# Patient Record
Sex: Female | Born: 1960 | Race: White | Hispanic: No | Marital: Married | Smoking: Former smoker
Health system: Southern US, Community
[De-identification: ages and names within clinical notes are randomized; demographics above are authoritative.]

## PROBLEM LIST (undated history)

## (undated) HISTORY — PX: OTHER SURGICAL HISTORY: SHX169

## (undated) HISTORY — PX: WISDOM TOOTH EXTRACTION: SHX21

---

## 1997-12-10 ENCOUNTER — Other Ambulatory Visit: Admission: RE | Admit: 1997-12-10 | Discharge: 1997-12-10 | Payer: Self-pay | Admitting: Gynecology

## 2002-04-25 ENCOUNTER — Other Ambulatory Visit: Admission: RE | Admit: 2002-04-25 | Discharge: 2002-04-25 | Payer: Self-pay | Admitting: Gynecology

## 2004-05-16 ENCOUNTER — Encounter: Admission: RE | Admit: 2004-05-16 | Discharge: 2004-05-16 | Payer: Self-pay | Admitting: Gynecology

## 2004-05-22 ENCOUNTER — Encounter: Admission: RE | Admit: 2004-05-22 | Discharge: 2004-05-22 | Payer: Self-pay | Admitting: Gynecology

## 2004-12-08 ENCOUNTER — Other Ambulatory Visit: Admission: RE | Admit: 2004-12-08 | Discharge: 2004-12-08 | Payer: Self-pay | Admitting: Gynecology

## 2006-03-05 ENCOUNTER — Ambulatory Visit: Payer: Self-pay | Admitting: Family Medicine

## 2011-02-25 ENCOUNTER — Other Ambulatory Visit: Payer: Self-pay | Admitting: Family Medicine

## 2011-02-25 ENCOUNTER — Ambulatory Visit (HOSPITAL_COMMUNITY)
Admission: RE | Admit: 2011-02-25 | Discharge: 2011-02-25 | Disposition: A | Discharge status: Home / Self Care | Payer: BC Managed Care – PPO | Source: Ambulatory Visit | Attending: Family Medicine | Admitting: Family Medicine

## 2011-02-25 DIAGNOSIS — R52 Pain, unspecified: Secondary | ICD-10-CM | POA: Insufficient documentation

## 2011-02-25 DIAGNOSIS — R0789 Other chest pain: Secondary | ICD-10-CM | POA: Insufficient documentation

## 2011-02-26 ENCOUNTER — Other Ambulatory Visit: Payer: Self-pay | Admitting: Family Medicine

## 2011-02-26 DIAGNOSIS — R52 Pain, unspecified: Secondary | ICD-10-CM

## 2011-03-19 ENCOUNTER — Other Ambulatory Visit (HOSPITAL_COMMUNITY): Payer: Self-pay | Admitting: Family Medicine

## 2011-03-19 DIAGNOSIS — R1084 Generalized abdominal pain: Secondary | ICD-10-CM

## 2011-03-24 ENCOUNTER — Other Ambulatory Visit (HOSPITAL_COMMUNITY): Payer: BC Managed Care – PPO

## 2011-03-24 ENCOUNTER — Ambulatory Visit (HOSPITAL_COMMUNITY)
Admission: RE | Admit: 2011-03-24 | Discharge: 2011-03-24 | Disposition: A | Discharge status: Home / Self Care | Payer: BC Managed Care – PPO | Source: Ambulatory Visit | Attending: Family Medicine | Admitting: Family Medicine

## 2011-03-24 DIAGNOSIS — R1084 Generalized abdominal pain: Secondary | ICD-10-CM

## 2011-03-24 DIAGNOSIS — K7689 Other specified diseases of liver: Secondary | ICD-10-CM | POA: Insufficient documentation

## 2011-03-25 ENCOUNTER — Other Ambulatory Visit (HOSPITAL_COMMUNITY): Payer: Self-pay | Admitting: Family Medicine

## 2011-03-25 DIAGNOSIS — R109 Unspecified abdominal pain: Secondary | ICD-10-CM

## 2011-03-31 ENCOUNTER — Ambulatory Visit (HOSPITAL_COMMUNITY)
Admission: RE | Admit: 2011-03-31 | Discharge: 2011-03-31 | Disposition: A | Discharge status: Home / Self Care | Payer: BC Managed Care – PPO | Source: Ambulatory Visit | Attending: Family Medicine | Admitting: Family Medicine

## 2011-03-31 DIAGNOSIS — R109 Unspecified abdominal pain: Secondary | ICD-10-CM

## 2011-03-31 DIAGNOSIS — R935 Abnormal findings on diagnostic imaging of other abdominal regions, including retroperitoneum: Secondary | ICD-10-CM | POA: Insufficient documentation

## 2011-04-06 ENCOUNTER — Other Ambulatory Visit (HOSPITAL_COMMUNITY): Payer: Self-pay | Admitting: Family Medicine

## 2011-04-06 DIAGNOSIS — K7689 Other specified diseases of liver: Secondary | ICD-10-CM

## 2011-04-08 ENCOUNTER — Ambulatory Visit (HOSPITAL_COMMUNITY)
Admission: RE | Admit: 2011-04-08 | Discharge: 2011-04-08 | Disposition: A | Discharge status: Home / Self Care | Payer: BC Managed Care – PPO | Source: Ambulatory Visit | Attending: Family Medicine | Admitting: Family Medicine

## 2011-04-08 DIAGNOSIS — K7689 Other specified diseases of liver: Secondary | ICD-10-CM | POA: Insufficient documentation

## 2011-04-08 DIAGNOSIS — D1803 Hemangioma of intra-abdominal structures: Secondary | ICD-10-CM | POA: Insufficient documentation

## 2011-04-08 MED ORDER — GADOBENATE DIMEGLUMINE 529 MG/ML IV SOLN
20.0000 mL | Freq: Once | INTRAVENOUS | Status: AC | PRN
  Administered 2011-04-08: 17 mL via INTRAVENOUS

## 2015-03-13 ENCOUNTER — Other Ambulatory Visit: Payer: Self-pay | Admitting: Adult Health Nurse Practitioner

## 2015-03-13 ENCOUNTER — Ambulatory Visit
Admission: RE | Admit: 2015-03-13 | Discharge: 2015-03-13 | Disposition: A | Discharge status: Home / Self Care | Payer: BLUE CROSS/BLUE SHIELD | Source: Ambulatory Visit | Attending: Adult Health Nurse Practitioner | Admitting: Adult Health Nurse Practitioner

## 2015-03-13 DIAGNOSIS — F17200 Nicotine dependence, unspecified, uncomplicated: Secondary | ICD-10-CM

## 2016-04-28 DIAGNOSIS — Z808 Family history of malignant neoplasm of other organs or systems: Secondary | ICD-10-CM | POA: Insufficient documentation

## 2016-04-28 DIAGNOSIS — E559 Vitamin D deficiency, unspecified: Secondary | ICD-10-CM | POA: Insufficient documentation

## 2016-04-28 DIAGNOSIS — J339 Nasal polyp, unspecified: Secondary | ICD-10-CM | POA: Insufficient documentation

## 2016-04-29 ENCOUNTER — Other Ambulatory Visit (HOSPITAL_COMMUNITY): Payer: Self-pay | Admitting: Adult Health Nurse Practitioner

## 2016-04-29 DIAGNOSIS — R221 Localized swelling, mass and lump, neck: Secondary | ICD-10-CM

## 2016-04-29 DIAGNOSIS — L659 Nonscarring hair loss, unspecified: Secondary | ICD-10-CM

## 2016-04-29 DIAGNOSIS — L853 Xerosis cutis: Secondary | ICD-10-CM

## 2016-05-06 ENCOUNTER — Ambulatory Visit (HOSPITAL_COMMUNITY)
Admission: RE | Admit: 2016-05-06 | Discharge: 2016-05-06 | Disposition: A | Discharge status: Home / Self Care | Payer: BLUE CROSS/BLUE SHIELD | Source: Ambulatory Visit | Attending: Adult Health Nurse Practitioner | Admitting: Adult Health Nurse Practitioner

## 2016-05-06 DIAGNOSIS — L853 Xerosis cutis: Secondary | ICD-10-CM | POA: Diagnosis not present

## 2016-05-06 DIAGNOSIS — R221 Localized swelling, mass and lump, neck: Secondary | ICD-10-CM | POA: Insufficient documentation

## 2016-05-06 DIAGNOSIS — E042 Nontoxic multinodular goiter: Secondary | ICD-10-CM | POA: Insufficient documentation

## 2016-05-06 DIAGNOSIS — L659 Nonscarring hair loss, unspecified: Secondary | ICD-10-CM | POA: Diagnosis not present

## 2016-05-22 DIAGNOSIS — J324 Chronic pansinusitis: Secondary | ICD-10-CM | POA: Insufficient documentation

## 2016-06-03 ENCOUNTER — Other Ambulatory Visit: Payer: Self-pay | Admitting: Otolaryngology

## 2016-06-03 DIAGNOSIS — J329 Chronic sinusitis, unspecified: Secondary | ICD-10-CM

## 2016-06-03 DIAGNOSIS — H9190 Unspecified hearing loss, unspecified ear: Secondary | ICD-10-CM

## 2016-06-17 ENCOUNTER — Ambulatory Visit
Admission: RE | Admit: 2016-06-17 | Discharge: 2016-06-17 | Disposition: A | Discharge status: Home / Self Care | Payer: BLUE CROSS/BLUE SHIELD | Source: Ambulatory Visit | Attending: Otolaryngology | Admitting: Otolaryngology

## 2016-06-17 DIAGNOSIS — H9190 Unspecified hearing loss, unspecified ear: Secondary | ICD-10-CM

## 2016-06-17 DIAGNOSIS — J329 Chronic sinusitis, unspecified: Secondary | ICD-10-CM

## 2016-06-17 MED ORDER — GADOBENATE DIMEGLUMINE 529 MG/ML IV SOLN
20.0000 mL | Freq: Once | INTRAVENOUS | Status: AC | PRN
  Administered 2016-06-17: 16 mL via INTRAVENOUS

## 2017-08-10 DIAGNOSIS — K7689 Other specified diseases of liver: Secondary | ICD-10-CM | POA: Insufficient documentation

## 2017-08-11 ENCOUNTER — Other Ambulatory Visit (HOSPITAL_COMMUNITY): Payer: Self-pay | Admitting: Adult Health Nurse Practitioner

## 2017-08-11 DIAGNOSIS — K7689 Other specified diseases of liver: Secondary | ICD-10-CM

## 2017-08-18 ENCOUNTER — Ambulatory Visit (HOSPITAL_COMMUNITY): Payer: BLUE CROSS/BLUE SHIELD

## 2019-03-29 ENCOUNTER — Other Ambulatory Visit: Payer: Self-pay

## 2019-03-29 ENCOUNTER — Ambulatory Visit: Payer: 59 | Admitting: Family Medicine

## 2019-03-29 ENCOUNTER — Ambulatory Visit (INDEPENDENT_AMBULATORY_CARE_PROVIDER_SITE_OTHER): Payer: 59

## 2019-03-29 ENCOUNTER — Encounter: Payer: Self-pay | Admitting: Family Medicine

## 2019-03-29 VITALS — BP 117/77 | HR 68 | Temp 99.1°F | Ht 67.0 in | Wt 197.0 lb

## 2019-03-29 DIAGNOSIS — N951 Menopausal and female climacteric states: Secondary | ICD-10-CM

## 2019-03-29 DIAGNOSIS — R0600 Dyspnea, unspecified: Secondary | ICD-10-CM | POA: Diagnosis not present

## 2019-03-29 DIAGNOSIS — E669 Obesity, unspecified: Secondary | ICD-10-CM

## 2019-03-29 DIAGNOSIS — R042 Hemoptysis: Secondary | ICD-10-CM

## 2019-03-29 DIAGNOSIS — Z7689 Persons encountering health services in other specified circumstances: Secondary | ICD-10-CM | POA: Diagnosis not present

## 2019-03-29 DIAGNOSIS — R0609 Other forms of dyspnea: Secondary | ICD-10-CM

## 2019-03-29 DIAGNOSIS — F17200 Nicotine dependence, unspecified, uncomplicated: Secondary | ICD-10-CM | POA: Diagnosis not present

## 2019-03-29 DIAGNOSIS — Z1211 Encounter for screening for malignant neoplasm of colon: Secondary | ICD-10-CM

## 2019-03-29 DIAGNOSIS — E559 Vitamin D deficiency, unspecified: Secondary | ICD-10-CM

## 2019-03-29 DIAGNOSIS — D1803 Hemangioma of intra-abdominal structures: Secondary | ICD-10-CM

## 2019-03-29 MED ORDER — VARENICLINE TARTRATE 1 MG PO TABS
1.0000 mg | ORAL_TABLET | Freq: Two times a day (BID) | ORAL | 2 refills | Status: DC
Start: 2019-03-29 — End: 2021-03-18

## 2019-03-29 MED ORDER — CHANTIX STARTING MONTH PAK 0.5 MG X 11 & 1 MG X 42 PO TABS: ORAL_TABLET | ORAL | 0 refills | Status: DC

## 2019-03-29 NOTE — Patient Instructions (Signed)
Call me about mammogram location and I will order.  Referred to GI today in [place].  We will set up CT chest and possible MRI of liver.

## 2019-03-29 NOTE — Progress Notes (Signed)
Subjective: Jennifer Schwartz care, liver hemangioma, tobacco use disorder HPI: Jennifer Schwartz is a 59 y.o. female presenting to clinic today for:  1.  Liver hemangioma Patient reports that she was found to have a liver hemangioma on imaging.  She had a MRI abdomen in 2013 which characterize this is a 1.7 cm benign hemangioma in the posterior right hepatic lobe with a less than 1 cm cyst in the anterior liver at the junction of the right and left hepatic lobes.  There is no evidence of abdominal malignancy or other significant abnormalities.  She does not report any right upper quadrant pain, nausea, vomiting.  No jaundice.  2.  Tobacco use disorder Patient reports 1 pack/day smoking for 40 years.  She has had 1 episode of scant hemoptysis.  She does have shortness of breath on exertion.  She is wondering if she should get imaging of her chest to further delineate this.  3.  Family history of cardiovascular disease Patient reports several strokes and aneurysms in multiple family members.  She is a smoker as above.  No history of hyperlipidemia, hypertension or diabetes.  4.  Preventive care Patient is undecided as to where she would like to get her mammogram done.  She would like to be referred to [facility] for colonoscopy.  Her last Pap smear was in 1994.  She has had HIV negative testing with previous pregnancy.  She notes that she has only had 3 periods in the last year.  She wonders if she is nearing menopause.  Her mom went through menopause in her 77s and ultimately required uterine surgery because she had excessive bleeding.  History reviewed. No pertinent past medical history. Past Surgical History:  Procedure Laterality Date  . NASAL POLYPS  1988 AND 1998   Social History   Socioeconomic History  . Marital status: Married    Spouse name: Not on file  . Number of children: Not on file  . Years of education: Not on file  . Highest education level: Not on file  Occupational History   . Not on file  Tobacco Use  . Smoking status: Not on file  Substance and Sexual Activity  . Alcohol use: Not on file  . Drug use: Not on file  . Sexual activity: Not on file  Other Topics Concern  . Not on file  Social History Narrative  . Not on file   Social Determinants of Health   Financial Resource Strain:   . Difficulty of Paying Living Expenses:   Food Insecurity:   . Worried About Charity fundraiser in the Last Year:   . Arboriculturist in the Last Year:   Transportation Needs:   . Film/video editor (Medical):   Marland Kitchen Lack of Transportation (Non-Medical):   Physical Activity:   . Days of Exercise per Week:   . Minutes of Exercise per Session:   Stress:   . Feeling of Stress :   Social Connections:   . Frequency of Communication with Friends and Family:   . Frequency of Social Gatherings with Friends and Family:   . Attends Religious Services:   . Active Member of Clubs or Organizations:   . Attends Archivist Meetings:   Marland Kitchen Marital Status:   Intimate Partner Violence:   . Fear of Current or Ex-Partner:   . Emotionally Abused:   Marland Kitchen Physically Abused:   . Sexually Abused:    Current Meds  Medication Sig  . Cholecalciferol (  VITAMIN D3) 1.25 MG (50000 UT) CAPS Take 1 capsule by mouth daily.   Family History  Problem Relation Age of Onset  . Diabetes Mother   . Stroke Mother   . Heart disease Mother   . Heart disease Father   . Hypertension Father    No Known Allergies   Health Maintenance:mammogram, colon ROS: Per HPI  Objective: Office vital signs reviewed. BP 117/77   Pulse 68   Temp 99.1 F (37.3 C) (Temporal)   Ht 5' 7"  (1.702 m)   Wt 197 lb (89.4 kg)   SpO2 98%   BMI 30.85 kg/m   Physical Examination:  General: Awake, alert, well nourished, No acute distress HEENT: Normal, sclera white.  Moist mucous membranes.  No carotid bruits Cardio: regular rate and rhythm, S1S2 heard, no murmurs appreciated Pulm: clear to auscultation  bilaterally, no wheezes, rhonchi or rales; normal work of breathing on room air GI: soft, non-tender, non-distended, bowel sounds present x4, no hepatomegaly, no splenomegaly, no masses Extremities: warm, well perfused, No edema, cyanosis or clubbing; +2 pulses bilaterally MSK: normal gait and station Skin: dry; intact; no rashes or lesions Neuro: No focal neurologic deficits  No results found. Assessment/ Plan: 58 y.o. female   1. Dyspnea on exertion Chest x-ray unremarkable. - DG Chest 2 View; Future - CBC; Future  2. Hemoptysis CT chest ordered given negative x-ray. - CBC; Future  3. Tobacco use disorder Chantix Rx'd. - DG Chest 2 View; Future - varenicline (CHANTIX STARTING MONTH PAK) 0.5 MG X 11 & 1 MG X 42 tablet; Take 0.5 mg tablet by mouth once daily x3 days, then 0.5 mg tablet twice daily x4 days, then increase to one 1 mg tablet twice daily.  Dispense: 53 tablet; Refill: 0 - varenicline (CHANTIX CONTINUING MONTH PAK) 1 MG tablet; Take 1 tablet (1 mg total) by mouth 2 (two) times daily.  Dispense: 60 tablet; Refill: 2  4. Establishing care with new doctor, encounter for  5. Hemangioma of liver Found to be benign on MRI. <5cm (1.7cm on MRI), therefore no further evaluation needed.  6. Perimenopausal - FSH/LH; Future  7. Obesity (BMI 30-39.9) - Bayer DCA Hb A1c Waived; Future - Lipid panel; Future - CMP14+EGFR; Future  8. Vitamin D deficiency - VITAMIN D 25 Hydroxy (Vit-D Deficiency, Fractures); Future  9. Screen for colon cancer - Ambulatory referral to Gastroenterology   Janora Norlander, [place] [phone]

## 2019-03-31 DIAGNOSIS — N951 Menopausal and female climacteric states: Secondary | ICD-10-CM | POA: Insufficient documentation

## 2019-03-31 DIAGNOSIS — E669 Obesity, unspecified: Secondary | ICD-10-CM | POA: Insufficient documentation

## 2019-03-31 DIAGNOSIS — D1803 Hemangioma of intra-abdominal structures: Secondary | ICD-10-CM | POA: Insufficient documentation

## 2019-03-31 DIAGNOSIS — F17200 Nicotine dependence, unspecified, uncomplicated: Secondary | ICD-10-CM | POA: Insufficient documentation

## 2019-04-03 ENCOUNTER — Telehealth: Payer: Self-pay | Admitting: Family Medicine

## 2019-04-03 ENCOUNTER — Encounter: Payer: Self-pay | Admitting: Family Medicine

## 2019-04-03 NOTE — Telephone Encounter (Signed)
Pt aware of normal CXR results.

## 2019-04-10 ENCOUNTER — Ambulatory Visit (HOSPITAL_COMMUNITY): Payer: 59

## 2019-04-12 ENCOUNTER — Encounter: Payer: Self-pay | Admitting: *Deleted

## 2019-06-06 ENCOUNTER — Other Ambulatory Visit: Payer: Self-pay | Admitting: Family Medicine

## 2019-06-06 DIAGNOSIS — R042 Hemoptysis: Secondary | ICD-10-CM

## 2019-06-06 DIAGNOSIS — R0609 Other forms of dyspnea: Secondary | ICD-10-CM

## 2019-06-06 DIAGNOSIS — R0602 Shortness of breath: Secondary | ICD-10-CM

## 2019-06-13 ENCOUNTER — Ambulatory Visit (HOSPITAL_COMMUNITY)
Admission: RE | Admit: 2019-06-13 | Discharge: 2019-06-13 | Disposition: A | Discharge status: Home / Self Care | Payer: No Typology Code available for payment source | Source: Ambulatory Visit | Attending: Family Medicine | Admitting: Family Medicine

## 2019-06-13 ENCOUNTER — Other Ambulatory Visit: Payer: Self-pay

## 2019-06-13 DIAGNOSIS — R0602 Shortness of breath: Secondary | ICD-10-CM | POA: Diagnosis not present

## 2019-06-21 ENCOUNTER — Other Ambulatory Visit: Payer: Self-pay | Admitting: Family Medicine

## 2019-06-21 DIAGNOSIS — Z808 Family history of malignant neoplasm of other organs or systems: Secondary | ICD-10-CM

## 2019-06-21 DIAGNOSIS — E079 Disorder of thyroid, unspecified: Secondary | ICD-10-CM

## 2019-06-23 ENCOUNTER — Other Ambulatory Visit: Payer: Self-pay

## 2019-06-23 ENCOUNTER — Other Ambulatory Visit: Payer: No Typology Code available for payment source

## 2019-06-23 DIAGNOSIS — R042 Hemoptysis: Secondary | ICD-10-CM

## 2019-06-23 DIAGNOSIS — N951 Menopausal and female climacteric states: Secondary | ICD-10-CM

## 2019-06-23 DIAGNOSIS — R0609 Other forms of dyspnea: Secondary | ICD-10-CM

## 2019-06-23 DIAGNOSIS — E559 Vitamin D deficiency, unspecified: Secondary | ICD-10-CM

## 2019-06-23 DIAGNOSIS — E669 Obesity, unspecified: Secondary | ICD-10-CM

## 2019-06-23 LAB — BAYER DCA HB A1C WAIVED: HB A1C (BAYER DCA - WAIVED): 5.9 % (ref <7.0)

## 2019-06-24 LAB — CBC
Hematocrit: 39.4 % (ref 34.0–46.6)
Hemoglobin: 13.3 g/dL (ref 11.1–15.9)
MCH: 29.3 pg (ref 26.6–33.0)
MCHC: 33.8 g/dL (ref 31.5–35.7)
MCV: 87 fL (ref 79–97)
Platelets: 311 10*3/uL (ref 150–450)
RBC: 4.54 x10E6/uL (ref 3.77–5.28)
RDW: 13.8 % (ref 11.7–15.4)
WBC: 5.2 10*3/uL (ref 3.4–10.8)

## 2019-06-24 LAB — CMP14+EGFR
ALT: 12 IU/L (ref 0–32)
AST: 15 IU/L (ref 0–40)
Albumin/Globulin Ratio: 1.7 (ref 1.2–2.2)
Albumin: 4.3 g/dL (ref 3.8–4.9)
Alkaline Phosphatase: 145 IU/L — ABNORMAL HIGH (ref 48–121)
BUN/Creatinine Ratio: 12 (ref 9–23)
BUN: 10 mg/dL (ref 6–24)
Bilirubin Total: 0.7 mg/dL (ref 0.0–1.2)
CO2: 24 mmol/L (ref 20–29)
Calcium: 9.3 mg/dL (ref 8.7–10.2)
Chloride: 104 mmol/L (ref 96–106)
Creatinine, Ser: 0.83 mg/dL (ref 0.57–1.00)
GFR calc Af Amer: 91 mL/min/{1.73_m2} (ref >59)
GFR calc non Af Amer: 79 mL/min/{1.73_m2} (ref >59)
Globulin, Total: 2.5 g/dL (ref 1.5–4.5)
Glucose: 96 mg/dL (ref 65–99)
Potassium: 4.4 mmol/L (ref 3.5–5.2)
Sodium: 141 mmol/L (ref 134–144)
Total Protein: 6.8 g/dL (ref 6.0–8.5)

## 2019-06-24 LAB — LIPID PANEL
Chol/HDL Ratio: 3.2 ratio (ref 0.0–4.4)
Cholesterol, Total: 172 mg/dL (ref 100–199)
HDL: 54 mg/dL (ref >39)
LDL Chol Calc (NIH): 104 mg/dL — ABNORMAL HIGH (ref 0–99)
Triglycerides: 77 mg/dL (ref 0–149)
VLDL Cholesterol Cal: 14 mg/dL (ref 5–40)

## 2019-06-24 LAB — THYROID PANEL WITH TSH
Free Thyroxine Index: 1.5 (ref 1.2–4.9)
T3 Uptake Ratio: 25 % (ref 24–39)
T4, Total: 5.9 ug/dL (ref 4.5–12.0)
TSH: 1.2 u[IU]/mL (ref 0.450–4.500)

## 2019-06-24 LAB — VITAMIN D 25 HYDROXY (VIT D DEFICIENCY, FRACTURES): Vit D, 25-Hydroxy: 60.4 ng/mL (ref 30.0–100.0)

## 2019-06-24 LAB — FSH/LH
FSH: 32.1 m[IU]/mL
LH: 29.9 m[IU]/mL

## 2019-06-29 ENCOUNTER — Ambulatory Visit (HOSPITAL_COMMUNITY)
Admission: RE | Admit: 2019-06-29 | Discharge: 2019-06-29 | Disposition: A | Discharge status: Home / Self Care | Payer: No Typology Code available for payment source | Source: Ambulatory Visit | Attending: Family Medicine | Admitting: Family Medicine

## 2019-06-29 ENCOUNTER — Other Ambulatory Visit: Payer: Self-pay

## 2019-06-29 DIAGNOSIS — Z808 Family history of malignant neoplasm of other organs or systems: Secondary | ICD-10-CM | POA: Diagnosis present

## 2019-06-29 DIAGNOSIS — E0789 Other specified disorders of thyroid: Secondary | ICD-10-CM | POA: Insufficient documentation

## 2019-06-29 DIAGNOSIS — E079 Disorder of thyroid, unspecified: Secondary | ICD-10-CM

## 2019-06-30 ENCOUNTER — Telehealth: Payer: Self-pay | Admitting: Family Medicine

## 2019-06-30 NOTE — Telephone Encounter (Signed)
Reviewed Thyroid US report with pt and pt states she will discuss further at upcoming appt with Dr Darnell Level on Thursday.

## 2019-06-30 NOTE — Telephone Encounter (Signed)
Pt returning call to get x-ray results.

## 2019-07-03 ENCOUNTER — Encounter: Payer: 59 | Admitting: Family Medicine

## 2019-07-04 ENCOUNTER — Other Ambulatory Visit: Payer: Self-pay

## 2019-07-04 ENCOUNTER — Other Ambulatory Visit (HOSPITAL_COMMUNITY)
Admission: RE | Admit: 2019-07-04 | Discharge: 2019-07-04 | Disposition: A | Discharge status: Home / Self Care | Payer: No Typology Code available for payment source | Source: Ambulatory Visit | Attending: Family Medicine | Admitting: Family Medicine

## 2019-07-04 ENCOUNTER — Encounter: Payer: Self-pay | Admitting: Family Medicine

## 2019-07-04 ENCOUNTER — Ambulatory Visit (INDEPENDENT_AMBULATORY_CARE_PROVIDER_SITE_OTHER): Payer: No Typology Code available for payment source | Admitting: Family Medicine

## 2019-07-04 VITALS — BP 106/75 | HR 65 | Temp 97.7°F | Ht 67.0 in | Wt 195.8 lb

## 2019-07-04 DIAGNOSIS — F17200 Nicotine dependence, unspecified, uncomplicated: Secondary | ICD-10-CM | POA: Diagnosis not present

## 2019-07-04 DIAGNOSIS — Z01411 Encounter for gynecological examination (general) (routine) with abnormal findings: Secondary | ICD-10-CM

## 2019-07-04 DIAGNOSIS — Z124 Encounter for screening for malignant neoplasm of cervix: Secondary | ICD-10-CM | POA: Insufficient documentation

## 2019-07-04 DIAGNOSIS — Z01419 Encounter for gynecological examination (general) (routine) without abnormal findings: Secondary | ICD-10-CM | POA: Diagnosis present

## 2019-07-04 NOTE — Progress Notes (Signed)
Jennifer Schwartz is a 59 y.o. female presents to office today for annual physical exam examination.    Concerns today include:   Substance: Reports that she is doing really well with smoking cessation.  She is down from over a pack per day to 6 cigarettes/day.  She is currently taking the Chantix twice daily and not having any side effects from the medication.  Denies any hemoptysis, sore throat, oropharyngeal masses  Last colonoscopy: Has not yet scheduled but it is on her to do list Last mammogram: Has not yet scheduled.  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 No pertinent past medical history. Social History   Socioeconomic History   Marital status: Married    Spouse name: Not on file   Number of children: Not on file   Years of education: Not on file   Highest education level: Not on file  Occupational History   Not on file  Tobacco Use   Smoking status: Current Every Day Smoker    Packs/day: 0.25   Smokeless tobacco: Never Used  Substance and Sexual Activity   Alcohol use: Not on file   Drug use: Never   Sexual activity: Not on file  Other Topics Concern   Not on file  Social History Narrative   Not on file   Social Determinants of Health   Financial Resource Strain:    Difficulty of Paying Living Expenses:   Food Insecurity:    Worried About Charity fundraiser in the Last Year:    Arboriculturist in the Last Year:   Transportation Needs:    Film/video editor (Medical):    Lack of Transportation (Non-Medical):   Physical Activity:    Days of Exercise per Week:    Minutes of Exercise per Session:   Stress:    Feeling of Stress :   Social Connections:    Frequency of Communication with Friends and Family:    Frequency of Social Gatherings with Friends and Family:    Attends Religious Services:    Active Member of Clubs  or Organizations:    Attends Music therapist:    Marital Status:   Intimate Partner Violence:    Fear of Current or Ex-Partner:    Emotionally Abused:    Physically Abused:    Sexually Abused:    Past Surgical History:  Procedure Laterality Date   NASAL POLYPS  1988 AND 1998   Family History  Problem Relation Age of Onset   Diabetes Mother    Stroke Mother    Heart disease Mother    Heart disease Father    Hypertension Father     Current Outpatient Medications:    varenicline (CHANTIX CONTINUING MONTH PAK) 1 MG tablet, Take 1 tablet (1 mg total) by mouth 2 (two) times daily., Disp: 60 tablet, Rfl: 2  No Known Allergies   ROS: Review of Systems Pertinent items noted in HPI and remainder of comprehensive ROS otherwise negative.    Physical exam BP 106/75    Pulse 65    Temp 97.7 F (36.5 C)    Ht 5\' 7"  (1.702 m)    Wt 195 lb 12.8 oz (88.8 kg)    SpO2 99%    BMI 30.67 kg/m  General appearance: alert, awake, well-appearing, no acute distress Head: Normocephalic, without obvious abnormality, atraumatic Eyes: negative findings: lids and lashes normal,  conjunctivae and sclerae normal, corneas clear and pupils equal, round, reactive to light and accomodation Ears: normal TM's and external ear canals both ears Nose: Nares normal. Septum midline. Mucosa normal. No drainage or sinus tenderness. Throat: lips, mucosa, and tongue normal; teeth and gums normal and No oropharyngeal masses Neck: no adenopathy, no carotid bruit, supple, symmetrical, trachea midline and thyroid not enlarged, symmetric, no tenderness/mass/nodules Back: symmetric, no curvature. ROM normal. No CVA tenderness. Lungs: clear to auscultation bilaterally Breasts: normal appearance, no masses or tenderness, No nipple retraction or dimpling, No nipple discharge or bleeding, No axillary or supraclavicular adenopathy Heart: regular rate and rhythm, S1, S2 normal, no murmur, click, rub or  gallop Abdomen: soft, non-tender; bowel sounds normal; no masses,  no organomegaly Pelvic: cervix normal in appearance, external genitalia normal, no adnexal masses or tenderness, no cervical motion tenderness, rectovaginal septum normal, uterus normal size, shape, and consistency and vagina normal without discharge Extremities: extremities normal, atraumatic, no cyanosis or edema Pulses: 2+ and symmetric Skin: She has a seborrheic keratosis noted along the left hip.  Skin otherwise unremarkable Lymph nodes: Cervical, supraclavicular, and axillary nodes normal. Neurologic: Alert and oriented X 3, normal strength and tone. Normal symmetric reflexes. Normal coordination and gait Psych: Mood stable, speech normal, affect appropriate, pleasant and interactive Depression screen [facility] 2/9 07/04/2019 03/29/2019  Decreased Interest 0 0  Down, Depressed, Hopeless 0 0  PHQ - 2 Score 0 0  Altered sleeping - 0  Tired, decreased energy - 0  Change in appetite - 0  Feeling bad or failure about yourself  - 0  Trouble concentrating - 0  Moving slowly or fidgety/restless - 0  PHQ-9 Score - 0      Assessment/ Plan: Jennifer Schwartz here for annual physical exam.   1. Well woman exam with routine gynecological exam Pap smear performed today.  Will contact patient with results once available.  She will schedule mammogram and colonoscopy - Cytology - PAP  2. Screening for malignant neoplasm of cervix - Cytology - PAP  3. Tobacco use disorder She is doing an excellent job with smoking cessation is down to 6 cigarettes.  We discussed okay to continue Chantix for 1 month beyond actual cessation of smoking to maintain remission and prevent relapse.  She will contact me if needed for refills  Counseled on healthy lifestyle choices, including diet (rich in fruits, vegetables and lean meats and low in salt and simple carbohydrates) and exercise (at least 30 minutes of moderate physical activity daily).  Patient  to follow up in 1 year for annual exam or sooner if needed.  Giuseppina Quinones M. Lajuana Ripple, DO

## 2019-07-04 NOTE — Patient Instructions (Signed)
Health Maintenance, Female Adopting a healthy lifestyle and getting preventive care are important in promoting health and wellness. Ask your health care provider about:  The right schedule for you to have regular tests and exams.  Things you can do on your own to prevent diseases and keep yourself healthy. What should I know about diet, weight, and exercise? Eat a healthy diet   Eat a diet that includes plenty of vegetables, fruits, low-fat dairy products, and lean protein.  Do not eat a lot of foods that are high in solid fats, added sugars, or sodium. Maintain a healthy weight Body mass index (BMI) is used to identify weight problems. It estimates body fat based on height and weight. Your health care provider can help determine your BMI and help you achieve or maintain a healthy weight. Get regular exercise Get regular exercise. This is one of the most important things you can do for your health. Most adults should:  Exercise for at least 150 minutes each week. The exercise should increase your heart rate and make you sweat (moderate-intensity exercise).  Do strengthening exercises at least twice a week. This is in addition to the moderate-intensity exercise.  Spend less time sitting. Even light physical activity can be beneficial. Watch cholesterol and blood lipids Have your blood tested for lipids and cholesterol at 58 years of age, then have this test every 5 years. Have your cholesterol levels checked more often if:  Your lipid or cholesterol levels are high.  You are older than 58 years of age.  You are at high risk for heart disease. What should I know about cancer screening? Depending on your health history and family history, you may need to have cancer screening at various ages. This may include screening for:  Breast cancer.  Cervical cancer.  Colorectal cancer.  Skin cancer.  Lung cancer. What should I know about heart disease, diabetes, and high blood  pressure? Blood pressure and heart disease  High blood pressure causes heart disease and increases the risk of stroke. This is more likely to develop in people who have high blood pressure readings, are of African descent, or are overweight.  Have your blood pressure checked: ? Every 3-5 years if you are 18-39 years of age. ? Every year if you are 40 years old or older. Diabetes Have regular diabetes screenings. This checks your fasting blood sugar level. Have the screening done:  Once every three years after age 40 if you are at a normal weight and have a low risk for diabetes.  More often and at a younger age if you are overweight or have a high risk for diabetes. What should I know about preventing infection? Hepatitis B If you have a higher risk for hepatitis B, you should be screened for this virus. Talk with your health care provider to find out if you are at risk for hepatitis B infection. Hepatitis C Testing is recommended for:  Everyone born from 1945 through 1965.  Anyone with known risk factors for hepatitis C. Sexually transmitted infections (STIs)  Get screened for STIs, including gonorrhea and chlamydia, if: ? You are sexually active and are younger than 58 years of age. ? You are older than 58 years of age and your health care provider tells you that you are at risk for this type of infection. ? Your sexual activity has changed since you were last screened, and you are at increased risk for chlamydia or gonorrhea. Ask your health care provider if   you are at risk.  Ask your health care provider about whether you are at high risk for HIV. Your health care provider may recommend a prescription medicine to help prevent HIV infection. If you choose to take medicine to prevent HIV, you should first get tested for HIV. You should then be tested every 3 months for as long as you are taking the medicine. Pregnancy  If you are about to stop having your period (premenopausal) and  you may become pregnant, seek counseling before you get pregnant.  Take 400 to 800 micrograms (mcg) of folic acid every day if you become pregnant.  Ask for birth control (contraception) if you want to prevent pregnancy. Osteoporosis and menopause Osteoporosis is a disease in which the bones lose minerals and strength with aging. This can result in bone fractures. If you are 65 years old or older, or if you are at risk for osteoporosis and fractures, ask your health care provider if you should:  Be screened for bone loss.  Take a calcium or vitamin D supplement to lower your risk of fractures.  Be given hormone replacement therapy (HRT) to treat symptoms of menopause. Follow these instructions at home: Lifestyle  Do not use any products that contain nicotine or tobacco, such as cigarettes, e-cigarettes, and chewing tobacco. If you need help quitting, ask your health care provider.  Do not use street drugs.  Do not share needles.  Ask your health care provider for help if you need support or information about quitting drugs. Alcohol use  Do not drink alcohol if: ? Your health care provider tells you not to drink. ? You are pregnant, may be pregnant, or are planning to become pregnant.  If you drink alcohol: ? Limit how much you use to 0-1 drink a day. ? Limit intake if you are breastfeeding.  Be aware of how much alcohol is in your drink. In the [place] one drink equals one 12 oz bottle of beer (355 mL), one 5 oz glass of wine (148 mL), or one 1 oz glass of hard liquor (44 mL). General instructions  Schedule regular health, dental, and eye exams.  Stay current with your vaccines.  Tell your health care provider if: ? You often feel depressed. ? You have ever been abused or do not feel safe at home. Summary  Adopting a healthy lifestyle and getting preventive care are important in promoting health and wellness.  Follow your health care provider's instructions about healthy  diet, exercising, and getting tested or screened for diseases.  Follow your health care provider's instructions on monitoring your cholesterol and blood pressure. This information is not intended to replace advice given to you by your health care provider. Make sure you discuss any questions you have with your health care provider. Document Revised: 12/22/2017 Document Reviewed: 12/22/2017 Elsevier Patient Education  2020 [employer] Inc[employer]  

## 2019-07-06 LAB — CYTOLOGY - PAP
Comment: NEGATIVE
Diagnosis: NEGATIVE
High risk HPV: POSITIVE — AB

## 2021-03-18 ENCOUNTER — Ambulatory Visit: Payer: No Typology Code available for payment source | Admitting: Nurse Practitioner

## 2021-03-18 ENCOUNTER — Encounter: Payer: Self-pay | Admitting: Nurse Practitioner

## 2021-03-18 VITALS — BP 117/76 | HR 91 | Temp 98.1°F | Resp 20 | Ht 67.0 in | Wt 197.0 lb

## 2021-03-18 DIAGNOSIS — R002 Palpitations: Secondary | ICD-10-CM

## 2021-03-18 DIAGNOSIS — E042 Nontoxic multinodular goiter: Secondary | ICD-10-CM | POA: Diagnosis not present

## 2021-03-18 NOTE — Patient Instructions (Signed)
Thyroid Nodule ?A thyroid nodule is an isolated growth of thyroid cells that forms a lump in your thyroid gland. The thyroid gland is a butterfly-shaped gland. It is found in the lower front of your neck. This gland sends chemical messengers (hormones) through your blood to all parts of your body. These hormones are important in regulating your body temperature and helping your body to use energy. ?Thyroid nodules are common. Most are not cancerous (benign). You may have one nodule or several nodules. ?Different types of thyroid nodules include nodules that: ?Grow and fill with fluid (thyroid cysts). ?Produce too much thyroid hormone (hot nodules or hyperthyroid). ?Produce no thyroid hormone (cold nodules or hypothyroid). ?Form from cancer cells (thyroid cancers). ?What are the causes? ?In most cases, the cause of this condition is not known. ?What increases the risk? ?The following factors may make you more likely to develop this condition. ?Age. Thyroid nodules become more common in people who are older than 60 years of age. ?Gender. ?Benign thyroid nodules are more common in women. ?Cancerous (malignant) thyroid nodules are more common in men. ?A family history that includes: ?Thyroid nodules. ?Pheochromocytoma. ?Thyroid carcinoma. ?Hyperparathyroidism. ?Certain kinds of thyroid diseases, such as Hashimoto's thyroiditis. ?Lack of iodine in your diet. ?A history of head and neck radiation, such as from previous cancer treatment. ?What are the signs or symptoms? ?In many cases, there are no symptoms. If you have symptoms, they may include: ?A lump in your lower neck. ?Feeling a lump or tickle in your throat. ?Pain in your neck, jaw, or ear. ?Having trouble swallowing. ?Hot nodules may cause symptoms that include: ?Weight loss. ?Warm, flushed skin. ?Feeling hot. ?Feeling nervous. ?A racing heartbeat. ?Cold nodules may cause symptoms that include: ?Weight gain. ?Dry skin. ?Brittle hair. This may also occur with hair  loss. ?Feeling cold. ?Fatigue. ?Thyroid cancer nodules may cause symptoms that include: ?Hard nodules that feel stuck to the thyroid gland. ?Hoarseness. ?Lumps in the glands near your thyroid (lymph nodes). ?How is this diagnosed? ?A thyroid nodule may be felt by your health care provider during a physical exam. This condition may also be diagnosed based on your symptoms. You may also have tests, including: ?An ultrasound. This may be done to confirm the diagnosis. ?A biopsy. This involves taking a sample from the nodule and looking at it under a microscope. ?Blood tests to make sure that your thyroid is working properly. ?A thyroid scan. This test uses a radioactive tracer injected into a vein to create an image of the thyroid gland on a computer screen. ?Imaging tests such as MRI or CT scan. These may be done if: ?Your nodule is large. ?Your nodule is blocking your airway. ?Cancer is suspected. ?How is this treated? ?Treatment depends on the cause and size of your nodule or nodules. If the nodule is benign, treatment may not be necessary. Your health care provider may monitor the nodule to see if it goes away without treatment. If the nodule continues to grow, is cancerous, or does not go away, treatment may be needed. Treatment may include: ?Having a cystic nodule drained with a needle. ?Ablation therapy. In this treatment, alcohol is injected into the area of the nodule to destroy the cells. Ablation with heat (thermal ablation) may also be used. ?Radioactive iodine. In this treatment, radioactive iodine is given as a pill or liquid that you drink. This substance causes the thyroid nodule to shrink. ?Surgery to remove the nodule. Part or all of your thyroid gland may  need to be removed as well. ?Medicines. ?Follow these instructions at home: ?Pay attention to any changes in your nodule. ?Take over-the-counter and prescription medicines only as told by your health care provider. ?Keep all follow-up visits as told  by your health care provider. This is important. ?Contact a health care provider if: ?Your voice changes. ?You have trouble swallowing. ?You have pain in your neck, ear, or jaw that is getting worse. ?Your nodule gets bigger. ?Your nodule starts to make it harder for you to breathe. ?Your muscles look like they are shrinking (muscle wasting). ?Get help right away if: ?You have chest pain. ?There is a loss of consciousness. ?You have a sudden fever. ?You feel confused. ?You are seeing or hearing things that other people do not see or hear (having hallucinations). ?You feel very weak. ?You have mood swings. ?You feel very restless. ?You feel suddenly nauseous or throw up. ?You suddenly have diarrhea. ?Summary ?A thyroid nodule is an isolated growth of thyroid cells that forms a lump in your thyroid gland. ?Thyroid nodules are common. Most are not cancerous (benign). You may have one nodule or several nodules. ?Treatment depends on the cause and size of your nodule or nodules. If the nodule is benign, treatment may not be necessary. ?Your health care provider may monitor the nodule to see if it goes away without treatment. If the nodule continues to grow, is cancerous, or does not go away, treatment may be needed. ?This information is not intended to replace advice given to you by your health care provider. Make sure you discuss any questions you have with your health care provider. ?Document Revised: 08/13/2017 Document Reviewed: 08/16/2017 ?Elsevier Patient Education ? [place]. ? ?

## 2021-03-18 NOTE — Progress Notes (Signed)
? ?  Subjective:  ? ? Patient ID: Jennifer Schwartz, female    DOB: 22-Sep-1960, 61 y.o.   MRN: [id number] ? ? ?Chief Complaint: Lumps on neck (SOB and feels like someone squeezing her wind pipe/Already diagnosed with nodules on thyroid) ? ? ?HPI ?Patient comes in today feeling a lump in her heck. She sometimes feels like she cannot swallow, but denies choking sensation. She has a history of thyroid nodules. Says she has felt very fatigued lately. She has not been seen and had blood work since 2021. ? ? ? ?Review of Systems  ?Constitutional: Negative.   ?HENT: Negative.  Negative for sore throat (very dry).   ?Respiratory:  Positive for shortness of breath.   ?Cardiovascular:  Positive for palpitations (almost daily). Negative for chest pain and leg swelling.  ?Neurological:  Positive for tremors (feels like sheis quivering.).  ?All other systems reviewed and are negative. ? ?   ?Objective:  ? Physical Exam ?Constitutional:   ?   Appearance: Normal appearance. She is obese.  ?Neck:  ?   Thyroid: Thyroid mass (bilaterally) present.  ?Cardiovascular:  ?   Rate and Rhythm: Normal rate and regular rhythm.  ?   Heart sounds: Normal heart sounds.  ?Pulmonary:  ?   Effort: Pulmonary effort is normal.  ?   Breath sounds: Normal breath sounds.  ?Skin: ?   General: Skin is warm.  ?Neurological:  ?   General: No focal deficit present.  ?   Mental Status: She is alert and oriented to person, place, and time.  ?   Cranial Nerves: No cranial nerve deficit.  ?   Sensory: No sensory deficit.  ? ?BP 117/76   Pulse 91   Temp 98.1 ?F (36.7 ?C) (Temporal)   Resp 20   Ht 5' 7" (1.702 m)   Wt 197 lb (89.4 kg)   SpO2 94%   BMI 30.85 kg/m?  ? ?Jennifer Nissen, FNP ? ? ? ?   ?Assessment & Plan:  ?Jennifer Schwartz in today with chief complaint of Lumps on neck (SOB and feels like someone squeezing her wind pipe/Already diagnosed with nodules on thyroid) ? ? ?1. Palpitations ?Avoid caffeine ?Keep diary of episodes ?- EKG  12-Lead ? ?2. Multiple thyroid nodules ?Labs pending ?Will call with lab results and U/S results ?- US THYROID; Future ?- CBC with Differential/Platelet ?- CMP14+EGFR ?- Thyroid Panel With TSH ? ? ? ?The above assessment and management plan was discussed with the patient. The patient verbalized understanding of and has agreed to the management plan. Patient is aware to call the clinic if symptoms persist or worsen. Patient is aware when to return to the clinic for a follow-up visit. Patient educated on when it is appropriate to go to the emergency department.  ? ?Jennifer Hassell Done, FNP ? ? ? ?

## 2021-03-19 LAB — CBC WITH DIFFERENTIAL/PLATELET
Basophils Absolute: 0 10*3/uL (ref 0.0–0.2)
Basos: 1 %
EOS (ABSOLUTE): 0.2 10*3/uL (ref 0.0–0.4)
Eos: 3 %
Hematocrit: 39.2 % (ref 34.0–46.6)
Hemoglobin: 13.4 g/dL (ref 11.1–15.9)
Immature Grans (Abs): 0 10*3/uL (ref 0.0–0.1)
Immature Granulocytes: 0 %
Lymphocytes Absolute: 1.8 10*3/uL (ref 0.7–3.1)
Lymphs: 32 %
MCH: 29.2 pg (ref 26.6–33.0)
MCHC: 34.2 g/dL (ref 31.5–35.7)
MCV: 85 fL (ref 79–97)
Monocytes Absolute: 0.4 10*3/uL (ref 0.1–0.9)
Monocytes: 7 %
Neutrophils Absolute: 3.2 10*3/uL (ref 1.4–7.0)
Neutrophils: 57 %
Platelets: 349 10*3/uL (ref 150–450)
RBC: 4.59 x10E6/uL (ref 3.77–5.28)
RDW: 14.1 % (ref 11.7–15.4)
WBC: 5.6 10*3/uL (ref 3.4–10.8)

## 2021-03-19 LAB — CMP14+EGFR
ALT: 15 IU/L (ref 0–32)
AST: 17 IU/L (ref 0–40)
Albumin/Globulin Ratio: 1.8 (ref 1.2–2.2)
Albumin: 4.2 g/dL (ref 3.8–4.9)
Alkaline Phosphatase: 115 IU/L (ref 44–121)
BUN/Creatinine Ratio: 15 (ref 9–23)
BUN: 11 mg/dL (ref 6–24)
Bilirubin Total: 0.7 mg/dL (ref 0.0–1.2)
CO2: 22 mmol/L (ref 20–29)
Calcium: 9.2 mg/dL (ref 8.7–10.2)
Chloride: 106 mmol/L (ref 96–106)
Creatinine, Ser: 0.74 mg/dL (ref 0.57–1.00)
Globulin, Total: 2.3 g/dL (ref 1.5–4.5)
Glucose: 101 mg/dL — ABNORMAL HIGH (ref 70–99)
Potassium: 4.3 mmol/L (ref 3.5–5.2)
Sodium: 142 mmol/L (ref 134–144)
Total Protein: 6.5 g/dL (ref 6.0–8.5)
eGFR: 93 mL/min/{1.73_m2} (ref >59)

## 2021-03-19 LAB — THYROID PANEL WITH TSH
Free Thyroxine Index: 1.7 (ref 1.2–4.9)
T3 Uptake Ratio: 25 % (ref 24–39)
T4, Total: 6.8 ug/dL (ref 4.5–12.0)
TSH: 1.18 u[IU]/mL (ref 0.450–4.500)

## 2021-03-25 ENCOUNTER — Other Ambulatory Visit: Payer: Self-pay

## 2021-03-25 ENCOUNTER — Ambulatory Visit (HOSPITAL_COMMUNITY)
Admission: RE | Admit: 2021-03-25 | Discharge: 2021-03-25 | Disposition: A | Discharge status: Home / Self Care | Payer: No Typology Code available for payment source | Source: Ambulatory Visit | Attending: Nurse Practitioner | Admitting: Nurse Practitioner

## 2021-03-25 DIAGNOSIS — E042 Nontoxic multinodular goiter: Secondary | ICD-10-CM | POA: Diagnosis present

## 2021-03-25 NOTE — Addendum Note (Signed)
Addended by: Chevis Pretty on: 03/25/2021 05:03 PM ? ? Modules accepted: Orders ? ?

## 2021-04-09 ENCOUNTER — Other Ambulatory Visit: Payer: Self-pay

## 2021-04-09 ENCOUNTER — Ambulatory Visit (HOSPITAL_COMMUNITY)
Admission: RE | Admit: 2021-04-09 | Discharge: 2021-04-09 | Disposition: A | Discharge status: Home / Self Care | Payer: No Typology Code available for payment source | Source: Ambulatory Visit | Attending: Nurse Practitioner | Admitting: Nurse Practitioner

## 2021-04-09 ENCOUNTER — Encounter (HOSPITAL_COMMUNITY): Payer: Self-pay

## 2021-04-09 DIAGNOSIS — E042 Nontoxic multinodular goiter: Secondary | ICD-10-CM | POA: Diagnosis present

## 2021-04-09 MED ORDER — LIDOCAINE HCL (PF) 2 % IJ SOLN
10.0000 mL | Freq: Once | INTRAMUSCULAR | Status: AC
Start: 2021-04-09 — End: 2021-04-09
  Administered 2021-04-09: 10 mL

## 2021-04-09 NOTE — Progress Notes (Signed)
PT tolerated thyroid biopsy procedure well today. Labs and afirma obtained and sent for pathology. PT ambulatory at discharge with no acute distress noted and verbalized understanding of discharge instructions. 

## 2021-04-11 LAB — CYTOLOGY - NON PAP

## 2021-05-14 ENCOUNTER — Other Ambulatory Visit (HOSPITAL_COMMUNITY)
Admission: RE | Admit: 2021-05-14 | Discharge: 2021-05-14 | Disposition: A | Discharge status: Home / Self Care | Payer: No Typology Code available for payment source | Source: Ambulatory Visit | Attending: Family Medicine | Admitting: Family Medicine

## 2021-05-14 ENCOUNTER — Ambulatory Visit (INDEPENDENT_AMBULATORY_CARE_PROVIDER_SITE_OTHER): Payer: No Typology Code available for payment source | Admitting: Family Medicine

## 2021-05-14 ENCOUNTER — Encounter: Payer: Self-pay | Admitting: Family Medicine

## 2021-05-14 VITALS — BP 121/79 | HR 66 | Temp 98.1°F | Ht 67.0 in | Wt 199.0 lb

## 2021-05-14 DIAGNOSIS — Z1231 Encounter for screening mammogram for malignant neoplasm of breast: Secondary | ICD-10-CM

## 2021-05-14 DIAGNOSIS — E042 Nontoxic multinodular goiter: Secondary | ICD-10-CM

## 2021-05-14 DIAGNOSIS — R87618 Other abnormal cytological findings on specimens from cervix uteri: Secondary | ICD-10-CM

## 2021-05-14 DIAGNOSIS — R499 Unspecified voice and resonance disorder: Secondary | ICD-10-CM

## 2021-05-14 DIAGNOSIS — F17200 Nicotine dependence, unspecified, uncomplicated: Secondary | ICD-10-CM

## 2021-05-14 DIAGNOSIS — Z1211 Encounter for screening for malignant neoplasm of colon: Secondary | ICD-10-CM

## 2021-05-14 DIAGNOSIS — R0989 Other specified symptoms and signs involving the circulatory and respiratory systems: Secondary | ICD-10-CM

## 2021-05-14 DIAGNOSIS — Z0001 Encounter for general adult medical examination with abnormal findings: Secondary | ICD-10-CM

## 2021-05-14 DIAGNOSIS — Z124 Encounter for screening for malignant neoplasm of cervix: Secondary | ICD-10-CM

## 2021-05-14 DIAGNOSIS — R09A2 Foreign body sensation, throat: Secondary | ICD-10-CM

## 2021-05-14 DIAGNOSIS — Z0000 Encounter for general adult medical examination without abnormal findings: Secondary | ICD-10-CM

## 2021-05-14 NOTE — Patient Instructions (Signed)
Get mammo scheduled, [facility] for [facility] ?Get colon cancer screen done. Referral to [facility] placed ?Get eval with ENT, referral placed to [facility]  ? ?Preventive Care 6-60 Years Old, Female ?Preventive care refers to lifestyle choices and visits with your health care provider that can promote health and wellness. Preventive care visits are also called wellness exams. ?What can I expect for my preventive care visit? ?Counseling ?Your health care provider may ask you questions about your: ?Medical history, including: ?Past medical problems. ?Family medical history. ?Pregnancy history. ?Current health, including: ?Menstrual cycle. ?Method of birth control. ?Emotional well-being. ?Home life and relationship well-being. ?Sexual activity and sexual health. ?Lifestyle, including: ?Alcohol, nicotine or tobacco, and drug use. ?Access to firearms. ?Diet, exercise, and sleep habits. ?Work and work Statistician. ?Sunscreen use. ?Safety issues such as seatbelt and bike helmet use. ?Physical exam ?Your health care provider will check your: ?Height and weight. These may be used to calculate your BMI (body mass index). BMI is a measurement that tells if you are at a healthy weight. ?Waist circumference. This measures the distance around your waistline. This measurement also tells if you are at a healthy weight and may help predict your risk of certain diseases, such as type 2 diabetes and high blood pressure. ?Heart rate and blood pressure. ?Body temperature. ?Skin for abnormal spots. ?What immunizations do I need? ? ?Vaccines are usually given at various ages, according to a schedule. Your health care provider will recommend vaccines for you based on your age, medical history, and lifestyle or other factors, such as travel or where you work. ?What tests do I need? ?Screening ?Your health care provider may recommend screening tests for certain conditions. This may include: ?Lipid and cholesterol levels. ?Diabetes screening.  This is done by checking your blood sugar (glucose) after you have not eaten for a while (fasting). ?Pelvic exam and Pap test. ?Hepatitis B test. ?Hepatitis C test. ?HIV (human immunodeficiency virus) test. ?STI (sexually transmitted infection) testing, if you are at risk. ?Lung cancer screening. ?Colorectal cancer screening. ?Mammogram. Talk with your health care provider about when you should start having regular mammograms. This may depend on whether you have a family history of breast cancer. ?BRCA-related cancer screening. This may be done if you have a family history of breast, ovarian, tubal, or peritoneal cancers. ?Bone density scan. This is done to screen for osteoporosis. ?Talk with your health care provider about your test results, treatment options, and if necessary, the need for more tests. ?Follow these instructions at home: ?Eating and drinking ? ?Eat a diet that includes fresh fruits and vegetables, whole grains, lean protein, and low-fat dairy products. ?Take vitamin and mineral supplements as recommended by your health care provider. ?Do not drink alcohol if: ?Your health care provider tells you not to drink. ?You are pregnant, may be pregnant, or are planning to become pregnant. ?If you drink alcohol: ?Limit how much you have to 0-1 drink a day. ?Know how much alcohol is in your drink. In the [place] one drink equals one 12 oz bottle of beer (355 mL), one 5 oz glass of wine (148 mL), or one 1? oz glass of hard liquor (44 mL). ?Lifestyle ?Brush your teeth every morning and night with fluoride toothpaste. Floss one time each day. ?Exercise for at least 30 minutes 5 or more days each week. ?Do not use any products that contain nicotine or tobacco. These products include cigarettes, chewing tobacco, and vaping devices, such as e-cigarettes. If you  need help quitting, ask your health care provider. ?Do not use drugs. ?If you are sexually active, practice safe sex. Use a condom or other form of protection  to prevent STIs. ?If you do not wish to become pregnant, use a form of birth control. If you plan to become pregnant, see your health care provider for a prepregnancy visit. ?Take aspirin only as told by your health care provider. Make sure that you understand how much to take and what form to take. Work with your health care provider to find out whether it is safe and beneficial for you to take aspirin daily. ?Find healthy ways to manage stress, such as: ?Meditation, yoga, or listening to music. ?Journaling. ?Talking to a trusted person. ?Spending time with friends and family. ?Minimize exposure to UV radiation to reduce your risk of skin cancer. ?Safety ?Always wear your seat belt while driving or riding in a vehicle. ?Do not drive: ?If you have been drinking alcohol. Do not ride with someone who has been drinking. ?When you are tired or distracted. ?While texting. ?If you have been using any mind-altering substances or drugs. ?Wear a helmet and other protective equipment during sports activities. ?If you have firearms in your house, make sure you follow all gun safety procedures. ?Seek help if you have been physically or sexually abused. ?What's next? ?Visit your health care provider once a year for an annual wellness visit. ?Ask your health care provider how often you should have your eyes and teeth checked. ?Stay up to date on all vaccines. ?This information is not intended to replace advice given to you by your health care provider. Make sure you discuss any questions you have with your health care provider. ?Document Revised: 06/26/2020 Document Reviewed: 06/26/2020 ?Elsevier Patient Education ? [place]. ? ?

## 2021-05-14 NOTE — Progress Notes (Signed)
? ?Jennifer Schwartz is a 61 y.o. female presents to office today for annual physical exam examination.   ? ?Concerns today include: ?1.  Neck fullness ?Patient reports she continues to have neck fullness, specifically right-sided but she does feel fullness that seems to radiate around the entire neck.  She reports globus sensation, change in voice.  She had thyroid ultrasound performed in March for multi nodular thyroid.  This demonstrated a right lobe of 4.7 x 1.6 x 1.2 and the left lobe of 4.6 x 1.5 x 1.2.  She had a total of 4 nodules identified, 1 of which met criteria for biopsy.  She has subsequently had that ultrasound-guided biopsy and this demonstrated benign follicular nodule.  No shortness of breath related to neck swelling etc. ? ?Occupation: seated work, Marital status: married, Substance use: none ?Diet: small portions, typical american, Exercise: none ?Last colonoscopy: never ?Last mammogram: needs ?Last pap smear: needs, HPV pos last time ?Refills needed today: none ?Immunizations needed: ?Immunization History  ?Administered Date(s) Administered  ? Tdap 07/02/2015  ? ? ? ?History reviewed. No pertinent past medical history. ?Social History  ? ?Socioeconomic History  ? Marital status: Married  ?  Spouse name: Not on file  ? Number of children: Not on file  ? Years of education: Not on file  ? Highest education level: Not on file  ?Occupational History  ? Not on file  ?Tobacco Use  ? Smoking status: Every Day  ?  Packs/day: 0.25  ?  Types: Cigarettes  ? Smokeless tobacco: Never  ?Vaping Use  ? Vaping Use: Never used  ?Substance and Sexual Activity  ? Alcohol use: Never  ? Drug use: Never  ? Sexual activity: Not on file  ?Other Topics Concern  ? Not on file  ?Social History Narrative  ? Not on file  ? ?Social Determinants of Health  ? ?Financial Resource Strain: Not on file  ?Food Insecurity: Not on file  ?Transportation Needs: Not on file  ?Physical Activity: Not on file  ?Stress: Not on file  ?Social  Connections: Not on file  ?Intimate Partner Violence: Not on file  ? ?Past Surgical History:  ?Procedure Laterality Date  ? [place] AND 1998  ? ?Family History  ?Problem Relation Age of Onset  ? Diabetes Mother   ? Stroke Mother   ? Heart disease Mother   ? Heart disease Father   ? Hypertension Father   ? ?No current outpatient medications on file. ? ?No Known Allergies  ? ?ROS: ?Review of Systems ?Pertinent items noted in HPI and remainder of comprehensive ROS otherwise negative.   ? ?Physical exam ?BP 121/79   Pulse 66   Temp 98.1 ?F (36.7 ?C)   Ht 5' 7"  (1.702 m)   Wt 199 lb (90.3 kg)   SpO2 97%   BMI 31.17 kg/m?  ?General appearance: alert, cooperative, appears stated age, no distress, and mildly obese ?Head: Normocephalic, without obvious abnormality, atraumatic ?Eyes: negative findings: lids and lashes normal, conjunctivae and sclerae normal, corneas clear, and pupils equal, round, reactive to light and accomodation ?Ears: normal TM's and external ear canals both ears ?Nose: Nares normal. Septum midline. Mucosa normal. No drainage or sinus tenderness. ?Throat: lips, mucosa, and tongue normal; teeth and gums normal ?Neck: no adenopathy, supple, symmetrical, trachea midline, and right neck with some fullness but not sure that this is truly thyromegaly.  No lymphadenopathy appreciated ?Back: symmetric, no curvature. ROM normal. No CVA tenderness. ?Lungs: clear to  auscultation bilaterally ?Breasts: normal appearance, no masses or tenderness, Inspection negative, No nipple retraction or dimpling, No nipple discharge or bleeding, No axillary or supraclavicular adenopathy, Normal to palpation without dominant masses ?Heart: regular rate and rhythm, S1, S2 normal, no murmur, click, rub or gallop ?Abdomen: soft, non-tender; bowel sounds normal; no masses,  no organomegaly ?Pelvic: cervix normal in appearance, external genitalia normal, no adnexal masses or tenderness, no cervical motion tenderness,  rectovaginal septum normal, uterus normal size, shape, and consistency, and mild moderate mucus discharge from os ?Extremities: extremities normal, atraumatic, no cyanosis or edema ?Pulses: 2+ and symmetric ?Skin:  Areas of hyperpigmentation, multiple pigmented nevi.  Varicose veins in the lower extremities noted ?Lymph nodes: Cervical, supraclavicular, and axillary nodes normal. ?Neurologic: Grossly normal ?Psych: Mood stable ? ? ?Assessment/ Plan: ?Jennifer Schwartz here for annual physical exam.  ? ?Annual physical exam ? ?Screening for malignant neoplasm of cervix - Plan: Cytology - PAP ? ?Abnormal Papanicolaou smear of cervix with positive human papilloma virus (HPV) test - Plan: Cytology - PAP ? ?Encounter for screening mammogram for malignant neoplasm of breast - Plan: MM 3D SCREEN BREAST BILATERAL ? ?Colon cancer screening - Plan: Ambulatory referral to Gastroenterology ? ?Multiple thyroid nodules - Plan: Ambulatory referral to ENT ? ?Tobacco use disorder ? ?Globus sensation - Plan: Ambulatory referral to ENT ? ?Change in voice - Plan: Ambulatory referral to ENT ? ?Pap smear updated today.  We discussed that since she has history of HPV positive that should be more closely assessed and then every few years.  Exam was notable for some mucus within the vaginal vault and cervix but no lesions appreciated on the cervix ? ?Screening mammogram has been placed to be done at [facility] due to Breast center fibrodense breast tissue.  No dominant nodules or masses noted on exam. ? ?Never had colon cancer screening.  No known first-degree relatives with colon cancer.  Not having any red flag signs or symptoms.  Referral to gastroenterology, Velora Heckler placed ? ?For these thyroid nodules, globus sensation and change in voice with neck fullness, I have recommended eval with ENT.  On her ultrasound there does not appear to be any significant asymmetry of the thyroid.  Uncertain the cause of her symptoms. ? ?Counseled on healthy  lifestyle choices, including diet (rich in fruits, vegetables and lean meats and low in salt and simple carbohydrates) and exercise (at least 30 minutes of moderate physical activity daily). ? ?Patient to follow up in 1 year for annual exam or sooner if needed. ? ?Jennifer Schwartz M. Kamyah Wilhelmsen, DO ? ? ? ? ? ?

## 2021-05-20 ENCOUNTER — Telehealth: Payer: Self-pay

## 2021-05-20 NOTE — Telephone Encounter (Signed)
Spoke to [facility] and it is correct the ancillary test was not completed on their end. Sending lab add on to them to get resulted ?

## 2021-05-20 NOTE — Telephone Encounter (Signed)
Thank you so much

## 2021-05-21 ENCOUNTER — Ambulatory Visit
Admission: RE | Admit: 2021-05-21 | Discharge: 2021-05-21 | Disposition: A | Discharge status: Home / Self Care | Payer: No Typology Code available for payment source | Source: Ambulatory Visit | Attending: Family Medicine | Admitting: Family Medicine

## 2021-05-21 DIAGNOSIS — Z1231 Encounter for screening mammogram for malignant neoplasm of breast: Secondary | ICD-10-CM

## 2021-05-23 LAB — CYTOLOGY - PAP
Comment: NEGATIVE
Diagnosis: NEGATIVE
High risk HPV: NEGATIVE

## 2021-05-29 ENCOUNTER — Other Ambulatory Visit: Payer: Self-pay | Admitting: Family Medicine

## 2021-05-29 DIAGNOSIS — Z1231 Encounter for screening mammogram for malignant neoplasm of breast: Secondary | ICD-10-CM

## 2022-05-18 ENCOUNTER — Encounter: Payer: Self-pay | Admitting: Family Medicine

## 2022-05-18 ENCOUNTER — Ambulatory Visit: Payer: No Typology Code available for payment source

## 2022-05-18 VITALS — BP 105/70 | HR 80 | Temp 98.5°F | Ht 67.0 in | Wt 191.0 lb

## 2022-05-18 DIAGNOSIS — R0982 Postnasal drip: Secondary | ICD-10-CM | POA: Diagnosis not present

## 2022-05-18 DIAGNOSIS — H6992 Unspecified Eustachian tube disorder, left ear: Secondary | ICD-10-CM | POA: Diagnosis not present

## 2022-05-18 DIAGNOSIS — R42 Dizziness and giddiness: Secondary | ICD-10-CM

## 2022-05-18 MED ORDER — MECLIZINE HCL 12.5 MG PO TABS: 12.5000 mg | ORAL_TABLET | Freq: Three times a day (TID) | ORAL | 0 refills | Status: DC | PRN

## 2022-05-18 MED ORDER — PREDNISONE 10 MG (21) PO TBPK: ORAL_TABLET | ORAL | 0 refills | Status: DC

## 2022-05-18 MED ORDER — LEVOCETIRIZINE DIHYDROCHLORIDE 5 MG PO TABS: 5.0000 mg | ORAL_TABLET | Freq: Every evening | ORAL | 3 refills | Status: DC | PRN

## 2022-05-18 MED ORDER — AZELASTINE HCL 0.1 % NA SOLN: 1.0000 | Freq: Two times a day (BID) | NASAL | 12 refills | Status: DC

## 2022-05-18 NOTE — Progress Notes (Signed)
Subjective: CC: Difficulty hearing PCP: Raliegh Ip, DO ZOX:WRUEAV L Feig is a 62 y.o. female presenting to clinic today for:  1.  Difficulty hearing Patient reports difficulty hearing out of the left ear for about 2 months now.  Has not appreciated any wax in that ear.  She used Flonase for about a week but it did not help.  She reports some intermittent dizziness but is not sure if it is present if she turns her head to 1 side or the other.  She is experiencing it slightly today but does not want to go see anyone to have Epley maneuver performed just yet.  Not currently taking any oral antihistamines or using any nasal sprays.  She is working from home so does not need a work note.  No fevers.  Does not report any other type of infectious symptoms.   ROS: Per HPI  No Known Allergies History reviewed. No pertinent past medical history. No current outpatient medications on file. Social History   Socioeconomic History   Marital status: Married    Spouse name: Not on file   Number of children: Not on file   Years of education: Not on file   Highest education level: Not on file  Occupational History   Not on file  Tobacco Use   Smoking status: Every Day    Packs/day: .25    Types: Cigarettes   Smokeless tobacco: Never  Vaping Use   Vaping Use: Never used  Substance and Sexual Activity   Alcohol use: Never   Drug use: Never   Sexual activity: Not on file  Other Topics Concern   Not on file  Social History Narrative   Not on file   Social Determinants of Health   Financial Resource Strain: Not on file  Food Insecurity: Not on file  Transportation Needs: Not on file  Physical Activity: Not on file  Stress: Not on file  Social Connections: Not on file  Intimate Partner Violence: Not on file   Family History  Problem Relation Age of Onset   Diabetes Mother    Stroke Mother    Heart disease Mother    Heart disease Father    Hypertension Father    Breast  cancer Neg Hx     Objective: Office vital signs reviewed. BP 105/70   Pulse 80   Temp 98.5 F (36.9 C)   Ht 5\' 7"  (1.702 m)   Wt 191 lb (86.6 kg)   LMP 02/25/2011   SpO2 99%   BMI 29.91 kg/m   Physical Examination:  General: Awake, alert, well nourished, No acute distress HEENT: Fluid appreciated behind bilateral TMs but this is clear.  No perforation, erythema or inflammation of the external auditory canal appreciated Neuro: No nystagmus appreciated.  She is alert and oriented and ambulating independently with normal gait  Assessment/ Plan: 62 y.o. female   Dysfunction of left eustachian tube - Plan: levocetirizine (XYZAL) 5 MG tablet, azelastine (ASTELIN) 0.1 % nasal spray, predniSONE (STERAPRED UNI-PAK 21 TAB) 10 MG (21) TBPK tablet  Post-nasal drainage - Plan: levocetirizine (XYZAL) 5 MG tablet, azelastine (ASTELIN) 0.1 % nasal spray  Dizziness - Plan: meclizine (ANTIVERT) 12.5 MG tablet  I suspect eustachian tube dysfunction in the setting of fluid from uncontrolled allergies.  Start Xyzal nightly.  Start Astelin to help with rhinorrhea.  I have given her a pocket prescription for prednisone if needed but hopefully the former medications will in fact clear her symptoms.  I  offered her referral to vestibular rehab should she desire but for now she would like to try with home therapies and so a handout was provided explaining how to do the Epley maneuver appropriately.  Antivert also prescribed if needed for uncontrolled dizzy spells.  I am not quite sure that she is experiencing BPPV as there is no nystagmus present and she cannot identify 1 side or the other that seem to precipitate symptoms.  Do suspect she has some inner ear dysfunction however due to the fluid levels that were present on exam today  No orders of the defined types were placed in this encounter.  No orders of the defined types were placed in this encounter.    Raliegh Ip, DO [facility] [phone]

## 2022-05-18 NOTE — Patient Instructions (Signed)
Eustachian Tube Dysfunction  Eustachian tube dysfunction refers to a condition in which a blockage develops in the narrow passage that connects the middle ear to the back of the nose (eustachian tube). The eustachian tube regulates air pressure in the middle ear by letting air move between the ear and nose. It also helps to drain fluid from the middle ear space. Eustachian tube dysfunction can affect one or both ears. When the eustachian tube does not function properly, air pressure, fluid, or both can build up in the middle ear. What are the causes? This condition occurs when the eustachian tube becomes blocked or cannot open normally. Common causes of this condition include: Ear infections. Colds and other infections that affect the nose, mouth, and throat (upper respiratory tract). Allergies. Irritation from cigarette smoke. Irritation from stomach acid coming up into the esophagus (gastroesophageal reflux). The esophagus is the part of the body that moves food from the mouth to the stomach. Sudden changes in air pressure, such as from descending in an airplane or scuba diving. Abnormal growths in the nose or throat, such as: Growths that line the nose (nasal polyps). Abnormal growth of cells (tumors). Enlarged tissue at the back of the throat (adenoids). What increases the risk? You are more likely to develop this condition if: You smoke. You are overweight. You are a child who has: Certain birth defects of the mouth, such as cleft palate. Large tonsils or adenoids. What are the signs or symptoms? Common symptoms of this condition include: A feeling of fullness in the ear. Ear pain. Clicking or popping noises in the ear. Ringing in the ear (tinnitus). Hearing loss. Loss of balance. Dizziness. Symptoms may get worse when the air pressure around you changes, such as when you travel to an area of high elevation, fly on an airplane, or go scuba diving. How is this diagnosed? This  condition may be diagnosed based on: Your symptoms. A physical exam of your ears, nose, and throat. Tests, such as those that measure: The movement of your eardrum. Your hearing (audiometry). How is this treated? Treatment depends on the cause and severity of your condition. In mild cases, you may relieve your symptoms by moving air into your ears. This is called "popping the ears." In more severe cases, or if you have symptoms of fluid in your ears, treatment may include: Medicines to relieve congestion (decongestants). Medicines that treat allergies (antihistamines). Nasal sprays or ear drops that contain medicines that reduce swelling (steroids). A procedure to drain the fluid in your eardrum. In this procedure, a small tube may be placed in the eardrum to: Drain the fluid. Restore the air in the middle ear space. A procedure to insert a balloon device through the nose to inflate the opening of the eustachian tube (balloon dilation). Follow these instructions at home: Lifestyle Do not do any of the following until your health care provider approves: Travel to high altitudes. Fly in airplanes. Work in a pressurized cabin or room. Scuba dive. Do not use any products that contain nicotine or tobacco. These products include cigarettes, chewing tobacco, and vaping devices, such as e-cigarettes. If you need help quitting, ask your health care provider. Keep your ears dry. Wear fitted earplugs during showering and bathing. Dry your ears completely after. General instructions Take over-the-counter and prescription medicines only as told by your health care provider. Use techniques to help pop your ears as recommended by your health care provider. These may include: Chewing gum. Yawning. Frequent, forceful swallowing.   Closing your mouth, holding your nose closed, and gently blowing as if you are trying to blow air out of your nose. Keep all follow-up visits. This is important. Contact a  health care provider if: Your symptoms do not go away after treatment. Your symptoms come back after treatment. You are unable to pop your ears. You have: A fever. Pain in your ear. Pain in your head or neck. Fluid draining from your ear. Your hearing suddenly changes. You become very dizzy. You lose your balance. Get help right away if: You have a sudden, severe increase in any of your symptoms. Summary Eustachian tube dysfunction refers to a condition in which a blockage develops in the eustachian tube. It can be caused by ear infections, allergies, inhaled irritants, or abnormal growths in the nose or throat. Symptoms may include ear pain or fullness, hearing loss, or ringing in the ears. Mild cases are treated with techniques to unblock the ears, such as yawning or chewing gum. More severe cases are treated with medicines or procedures. This information is not intended to replace advice given to you by your health care provider. Make sure you discuss any questions you have with your health care provider. Document Revised: 03/11/2020 Document Reviewed: 03/11/2020 Elsevier Patient Education  2023 [employer]  

## 2022-07-22 ENCOUNTER — Telehealth: Payer: Self-pay | Admitting: Family Medicine

## 2022-07-22 DIAGNOSIS — R42 Dizziness and giddiness: Secondary | ICD-10-CM

## 2022-07-22 DIAGNOSIS — H6992 Unspecified Eustachian tube disorder, left ear: Secondary | ICD-10-CM

## 2022-07-22 NOTE — Telephone Encounter (Signed)
REFERRAL REQUEST Telephone Note  Have you been seen at our office for this problem? YES (Advise that they may need an appointment with their PCP before a referral can be done)  Reason for Referral: Left ear still stopped up and needs ENT appt Referral discussed with patient: YES  Best contact number of patient for referral team: [phone]    Has patient been seen by a specialist for this issue before: NO  Patient provider preference for referral: N/A Patient location preference for referral: [facility]   Patient notified that referrals can take up to a week or longer to process. If they haven't heard anything within a week they should call back and speak with the referral department.

## 2022-07-23 NOTE — Telephone Encounter (Signed)
Referral to ENT placed.   Jannifer Rodney, FNP

## 2022-08-14 ENCOUNTER — Ambulatory Visit (INDEPENDENT_AMBULATORY_CARE_PROVIDER_SITE_OTHER): Payer: No Typology Code available for payment source | Admitting: Otolaryngology

## 2022-08-14 ENCOUNTER — Encounter (INDEPENDENT_AMBULATORY_CARE_PROVIDER_SITE_OTHER): Payer: Self-pay | Admitting: Otolaryngology

## 2022-08-14 ENCOUNTER — Telehealth: Payer: Self-pay

## 2022-08-14 VITALS — BP 113/72 | HR 80 | Ht 67.0 in | Wt 190.0 lb

## 2022-08-14 DIAGNOSIS — J392 Other diseases of pharynx: Secondary | ICD-10-CM | POA: Diagnosis not present

## 2022-08-14 DIAGNOSIS — H9192 Unspecified hearing loss, left ear: Secondary | ICD-10-CM

## 2022-08-14 DIAGNOSIS — J329 Chronic sinusitis, unspecified: Secondary | ICD-10-CM

## 2022-08-14 DIAGNOSIS — H6993 Unspecified Eustachian tube disorder, bilateral: Secondary | ICD-10-CM

## 2022-08-14 DIAGNOSIS — K146 Glossodynia: Secondary | ICD-10-CM

## 2022-08-14 DIAGNOSIS — K117 Disturbances of salivary secretion: Secondary | ICD-10-CM

## 2022-08-14 DIAGNOSIS — R0981 Nasal congestion: Secondary | ICD-10-CM | POA: Diagnosis not present

## 2022-08-14 DIAGNOSIS — J3089 Other allergic rhinitis: Secondary | ICD-10-CM

## 2022-08-14 DIAGNOSIS — H6992 Unspecified Eustachian tube disorder, left ear: Secondary | ICD-10-CM

## 2022-08-14 MED ORDER — DESLORATADINE 5 MG PO TABS: 5.0000 mg | ORAL_TABLET | Freq: Every day | ORAL | 3 refills | Status: DC

## 2022-08-14 NOTE — Progress Notes (Signed)
ENT CONSULT:  Reason for Consult: nasal congestion, hx of nasal polyps, decreased hearing on the left x 5 months, dry mouth/tongue    HPI: Jennifer Schwartz is an 62 y.o. female with history of chronic sinusitis and nasal polyps in the past previously requiring sinus surgery with Caldwell-Luc approach and many years ago, history of allergies and nasal congestion, who is here for nasal congestion postnasal drainage and 5 months of decreased hearing on the left side described as muffled hearing.  He also reports a patch over her dorsal tongue that appears to be sensitive and somewhat painful.  - nasal congestion -previously started on Xyzal and azelastine by PCP, reports she did not tolerate Xyzal.  She uses saline rinses for her nasal congestion with a small amount of peroxide, and regimen recommended by her sinus surgeon several years ago.  Reports good sense of smell.  No recent sinus infections requiring antibiotics. -Her decreased hearing on the left side is relatively new for her, and she states that her hearing was completely normal up until 5 months ago when she noticed she could not hear out of the left ear, no tinnitus.  She feels her left ear hearing is muffled and it is 80% less than in the past.  No vertigo, gets nauseated when looking up at times and feels off balance for a few seconds from time to time. No hx of asthma or allergies to ASA/NSAIDs.  -She reports that the small area of her dorsal tongue appears to be sensitive and painful when she eats foods that are acidic.  Denies numbness or tingling or paresthesias.  Not on multivitamin currently.   Records Reviewed:  Hx of thyroid nodules reportedly benign (Had FNA)    History reviewed. No pertinent past medical history.  Past Surgical History:  Procedure Laterality Date   NASAL POLYPS  1988 AND 1998    Family History  Problem Relation Age of Onset   Diabetes Mother    Stroke Mother    Heart disease Mother    Heart disease  Father    Hypertension Father    Breast cancer Neg Hx     Social History:  reports that she has been smoking cigarettes. She has never used smokeless tobacco. She reports that she does not drink alcohol and does not use drugs.  Allergies: No Known Allergies  Medications: I have reviewed the patient's current medications.  The PMH, PSH, Medications, Allergies, and SH were reviewed and updated.  ROS: Constitutional: Negative for fever, weight loss and weight gain. Cardiovascular: Negative for chest pain and dyspnea on exertion. Respiratory: Is not experiencing shortness of breath at rest. Gastrointestinal: Negative for nausea and vomiting. Neurological: Negative for headaches. Psychiatric: The patient is not nervous/anxious  Blood pressure 113/72, pulse 80, height 5\' 7"  (1.702 m), weight 190 lb (86.2 kg), last menstrual period 02/25/2011, SpO2 98%.  PHYSICAL EXAM:  Exam: General: Well-developed, well-nourished Respiratory Respiratory effort: Equal inspiration and expiration without stridor Cardiovascular Peripheral Vascular: Warm extremities with equal color/perfusion Eyes: No nystagmus with equal extraocular motion bilaterally Neuro/Psych/Balance: Patient oriented to person, place, and time; Appropriate mood and affect; Gait is intact with no imbalance; Cranial nerves I-XII are intact Head and Face Inspection: Normocephalic and atraumatic without mass or lesion Palpation: Facial skeleton intact without bony stepoffs Salivary Glands: No mass or tenderness Facial Strength: Facial motility symmetric and full bilaterally ENT Pinna: External ear intact and fully developed External canal: Canal is patent with intact skin Tympanic Membrane: Clear and  mobile External Nose: No scar or anatomic deformity Internal Nose: see procedure note Bilateral inferior turbinate hypertrophy.  Lips, Teeth, and gums: Mucosa and teeth intact and viable TMJ: No pain to palpation with full  mobility Oral cavity/oropharynx: No erythema or exudate, no lesions present Nasopharynx: see procedure note  Neck Neck and Trachea: Midline trachea without mass or lesion Thyroid: No mass or nodularity Lymphatics: No lymphadenopathy  Procedure:   PROCEDURE NOTE: nasal endoscopy  Preoperative diagnosis: chronic sinusitis symptoms  Postoperative diagnosis: same  Procedure: Diagnostic nasal endoscopy (40981)  Surgeon: Ashok Croon, M.D.  Anesthesia: Topical lidocaine and Afrin  H&P REVIEW: The patient's history and physical were reviewed today prior to procedure. All medications were reviewed and updated as well. Complications: None Condition is stable throughout exam Indications and consent: The patient presents with symptoms of chronic sinusitis not responding to previous therapies. All the risks, benefits, and potential complications were reviewed with the patient preoperatively and informed consent was obtained. The time out was completed with confirmation of the correct procedure.   Procedure: The patient was seated upright in the clinic. Topical lidocaine and Afrin were applied to the nasal cavity. After adequate anesthesia had occurred, the rigid nasal endoscope was passed into the nasal cavity. The nasal mucosa, turbinates, septum, and sinus drainage pathways were visualized bilaterally. This revealed septal perforation, minimal purulence and significant clear secretions along middle meatus, nasopharynx and evidence of surgical changes (right sided maxillary antrostomy, evidence of inferior turbinate reduction). 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  There were no polyps but nasal mucosa was edematous and there was septal deviation with S-shaped septum worse on the right.  The scope was then slowly withdrawn and the patient tolerated the procedure well. There were no  complications or blood loss.   Studies Reviewed  FNA Path 04/10/22  Specimen Submitted:  A. THYROID, LLL, FINE NEEDLE ASPIRATION:    FINAL MICROSCOPIC DIAGNOSIS:  - Consistent with benign follicular nodule (Bethesda category II)   Thyroid U/S 03/26/22 IMPRESSION: 1. Enlarging left inferior thyroid nodule has become more hypoechoic in appearance which upgrades the lesion to TI-RADS category 4. This nodule now meets criteria to consider fine-needle aspiration biopsy. 2. Continued stability of left superior thyroid nodule dating back to April of 2018. The present study confirms approximately 5 year stability and thus benignity. No further follow-up required.  Assessment/Plan: Encounter Diagnoses  Name Primary?   Environmental and seasonal allergies    Nasal congestion    Dysfunction of both eustachian tubes    Hearing loss of left ear, unspecified hearing loss type Yes   Chronic sinusitis, unspecified location    Pharyngeal or nasopharyngeal cyst    Dysfunction of left eustachian tube    Xerostomia    Burning mouth syndrome    Female with a history of chronic nasal congestion and prior sinus surgery, many years ago, currently on saline rinses with peroxide as needed, here for multiple complaints including nasal congestion postnasal drainage and decreased hearing on the left side.  She reports that hearing is muffled and she first noticed it 5 months ago, received a steroid taper from PCP, states there was no change in her symptoms.  No prior hearing test.  No dizziness or vertigo.  Previously given prescription for Xyzal and azelastine, but currently not doing anything for nasal congestion aside from nasal saline rinses.  Also reports with appears to be a burning syndrome that  following along the patch of her dorsal tongue.   On my exam there is evidence of postsurgical changes no recurrent polyposis, but there was significant nasal mucosal edema and somewhat purulent secretions  throughout the nasal passages and in the nasopharynx.  She had a septal perforation as well as mild septal deviation and mucosal edema.  There was a prominent patchy mucosa adenoid hypertrophy versus nasopharyngeal cyst in the midline of the nasopharynx, but no masses near the eustachian tube opening.  We discussed exam findings and the need for hearing test to assess decreased hearing.  DDx includes hearing loss from [facility] versus as SNHL vs versus eustachian tube dysfunction in the setting of chronic allergies and nasal congestion.  Will also obtain CT of the sinuses max face area, to rule out chronic sinus inflammation and better evaluate nasopharyngeal cyst.  I discussed that her tongue sensation is likely attributed to burning mouth syndrome and recommended to check vitamin levels with PCP during her next follow-up.  I also advised her to start multivitamin supplementation and try biotin and increasing hydration.  I did not note any lesions or mucosal changes on her tongue exam, but the area of concern appears to be dry.  We will also initiate mometasone nasal rinses.  She will return after testing.  - mometasone nasal rinses  - CT max/face  - Audio - RTC after testing   Thank you for allowing me to participate in the care of this patient. Please do not hesitate to contact me with any questions or concerns.   Ashok Croon, MD Otolaryngology [facility] Phone: [phone] Fax: [phone]    08/14/2022, 10:38 AM

## 2022-08-14 NOTE — Patient Instructions (Signed)
We will fax over an order for steroid carpule shipment to you directly to the compounding pharmacy in [place], [place]. They will call you to quote you the price and if acceptable, you can order it to add to the nasal saline rinses   NeilMed saline irrigation bottle can be found/purchased at the following website (or at any drug store):  https://shop.neilmed.com/products/sinus-rinse-starter-kit-[email or web address]-packets  Please search for YouTube videos to show you how to irrigate with saline (google sinus irrigation and filter for videos)

## 2022-08-14 NOTE — Telephone Encounter (Signed)
Faxed medication order for Mometasone: 0.6mg /dose and sinus rinse bottle to [facility].  Received fax confirmation

## 2022-09-02 ENCOUNTER — Telehealth: Payer: Self-pay

## 2022-09-02 NOTE — Telephone Encounter (Signed)
error 

## 2022-09-02 NOTE — Telephone Encounter (Signed)
No Pre-auth required for CPT [phone] good from 08/22-10/22/2024  Called patient  notified to call Pre-service to schedule CT appointment

## 2022-09-17 ENCOUNTER — Ambulatory Visit: Payer: No Typology Code available for payment source | Admitting: Audiologist

## 2022-09-22 ENCOUNTER — Ambulatory Visit (HOSPITAL_COMMUNITY)
Admission: RE | Admit: 2022-09-22 | Discharge: 2022-09-22 | Disposition: A | Discharge status: Home / Self Care | Payer: No Typology Code available for payment source | Source: Ambulatory Visit | Attending: Otolaryngology | Admitting: Otolaryngology

## 2022-09-22 DIAGNOSIS — J392 Other diseases of pharynx: Secondary | ICD-10-CM | POA: Insufficient documentation

## 2022-09-22 DIAGNOSIS — J329 Chronic sinusitis, unspecified: Secondary | ICD-10-CM | POA: Diagnosis present

## 2022-09-22 MED ORDER — IOHEXOL 350 MG/ML SOLN
75.0000 mL | Freq: Once | INTRAVENOUS | Status: AC | PRN
  Administered 2022-09-22: 75 mL via INTRAVENOUS

## 2022-10-14 ENCOUNTER — Encounter (INDEPENDENT_AMBULATORY_CARE_PROVIDER_SITE_OTHER): Payer: Self-pay | Admitting: Otolaryngology

## 2022-10-14 ENCOUNTER — Ambulatory Visit (INDEPENDENT_AMBULATORY_CARE_PROVIDER_SITE_OTHER): Payer: No Typology Code available for payment source | Admitting: Otolaryngology

## 2022-10-14 VITALS — BP 119/78 | HR 89 | Ht 67.0 in | Wt 194.0 lb

## 2022-10-14 DIAGNOSIS — H6992 Unspecified Eustachian tube disorder, left ear: Secondary | ICD-10-CM

## 2022-10-14 DIAGNOSIS — J3089 Other allergic rhinitis: Secondary | ICD-10-CM

## 2022-10-14 DIAGNOSIS — J392 Other diseases of pharynx: Secondary | ICD-10-CM

## 2022-10-14 DIAGNOSIS — K146 Glossodynia: Secondary | ICD-10-CM

## 2022-10-14 DIAGNOSIS — H9192 Unspecified hearing loss, left ear: Secondary | ICD-10-CM

## 2022-10-14 DIAGNOSIS — R0981 Nasal congestion: Secondary | ICD-10-CM

## 2022-10-14 DIAGNOSIS — J342 Deviated nasal septum: Secondary | ICD-10-CM

## 2022-10-14 DIAGNOSIS — K117 Disturbances of salivary secretion: Secondary | ICD-10-CM

## 2022-10-14 DIAGNOSIS — J343 Hypertrophy of nasal turbinates: Secondary | ICD-10-CM

## 2022-10-14 DIAGNOSIS — H6993 Unspecified Eustachian tube disorder, bilateral: Secondary | ICD-10-CM

## 2022-10-14 MED ORDER — DESLORATADINE 5 MG PO TABS: 5.0000 mg | ORAL_TABLET | Freq: Every day | ORAL | 2 refills | Status: DC

## 2022-10-14 MED ORDER — SALINE SPRAY 0.65 % NA SOLN: 1.0000 | NASAL | 0 refills | Status: DC | PRN

## 2022-10-14 MED ORDER — FLUTICASONE PROPIONATE 50 MCG/ACT NA SUSP: 2.0000 | Freq: Every day | NASAL | 6 refills | Status: DC

## 2022-10-14 NOTE — Progress Notes (Addendum)
ENT Progress Note:  Update 10/14/22: She returns after imaging (CT Max/Face).  Unfortunately due to lack of insurance coverage and high deductible she was not able to complete hearing evaluation.  She did mometasone nasal rinses for a month and then stopped due to lack of symptom improvement.  She continues to have nasal congestion.  She states today that her hearing is adequate but she still has muffled hearing, which is more pronounced on the left side.   Initial Evaluation 08/14/22  Reason for Consult: nasal congestion, hx of nasal polyps, decreased hearing on the left x 5 months, dry mouth/tongue    HPI: Jennifer Schwartz is an 62 y.o. female with history of chronic sinusitis and nasal polyps in the past previously requiring sinus surgery with Caldwell-Luc approach and many years ago, history of allergies and nasal congestion, who is here for nasal congestion postnasal drainage and 5 months of decreased hearing on the left side described as muffled hearing.  He also reports a patch over her dorsal tongue that appears to be sensitive and somewhat painful.  - nasal congestion -previously started on Xyzal and azelastine by PCP, reports she did not tolerate Xyzal.  She uses saline rinses for her nasal congestion with a small amount of peroxide, and regimen recommended by her sinus surgeon several years ago.  Reports good sense of smell.  No recent sinus infections requiring antibiotics. -Her decreased hearing on the left side is relatively new for her, and she states that her hearing was completely normal up until 5 months ago when she noticed she could not hear out of the left ear, no tinnitus.  She feels her left ear hearing is muffled and it is 80% less than in the past.  No vertigo, gets nauseated when looking up at times and feels off balance for a few seconds from time to time. No hx of asthma or allergies to ASA/NSAIDs.  -She reports that the small area of her dorsal tongue appears to be sensitive and  painful when she eats foods that are acidic.  Denies numbness or tingling or paresthesias.  Not on multivitamin currently.   Records Reviewed:  Hx of thyroid nodules reportedly benign (Had FNA)    History reviewed. No pertinent past medical history.  Past Surgical History:  Procedure Laterality Date   NASAL POLYPS  1988 AND 1998    Family History  Problem Relation Age of Onset   Diabetes Mother    Stroke Mother    Heart disease Mother    Heart disease Father    Hypertension Father    Breast cancer Neg Hx     Social History:  reports that she has been smoking cigarettes. She has never used smokeless tobacco. She reports that she does not drink alcohol and does not use drugs.  Allergies: No Known Allergies  Medications: I have reviewed the patient's current medications.  The PMH, PSH, Medications, Allergies, and SH were reviewed and updated.  ROS: Constitutional: Negative for fever, weight loss and weight gain. Cardiovascular: Negative for chest pain and dyspnea on exertion. Respiratory: Is not experiencing shortness of breath at rest. Gastrointestinal: Negative for nausea and vomiting. Neurological: Negative for headaches. Psychiatric: The patient is not nervous/anxious  Blood pressure 119/78, pulse 89, height 5\' 7"  (1.702 m), weight 194 lb (88 kg), last menstrual period 02/25/2011, SpO2 97%.  PHYSICAL EXAM:  Exam: General: Well-developed, well-nourished Respiratory Respiratory effort: Equal inspiration and expiration without stridor Cardiovascular Peripheral Vascular: Warm extremities with equal color/perfusion Eyes:  No nystagmus with equal extraocular motion bilaterally Neuro/Psych/Balance: Patient oriented to person, place, and time; Appropriate mood and affect; Gait is intact with no imbalance; Cranial nerves I-XII are intact Head and Face Inspection: Normocephalic and atraumatic without mass or lesion Palpation: Facial skeleton intact without bony  stepoffs Salivary Glands: No mass or tenderness Facial Strength: Facial motility symmetric and full bilaterally ENT Pinna: External ear intact and fully developed External Nose: No scar or anatomic deformity Nasopharynx: see procedure note  Neck Neck and Trachea: Midline trachea without mass or lesion  Procedure performed during last office visit on 08/20/2022:   PROCEDURE NOTE: nasal endoscopy  Preoperative diagnosis: chronic sinusitis symptoms  Postoperative diagnosis: same  Procedure: Diagnostic nasal endoscopy (16109)  Surgeon: Ashok Croon, M.D.  Anesthesia: Topical lidocaine and Afrin  H&P REVIEW: The patient's history and physical were reviewed today prior to procedure. All medications were reviewed and updated as well. Complications: None Condition is stable throughout exam Indications and consent: The patient presents with symptoms of chronic sinusitis not responding to previous therapies. All the risks, benefits, and potential complications were reviewed with the patient preoperatively and informed consent was obtained. The time out was completed with confirmation of the correct procedure.   Procedure: The patient was seated upright in the clinic. Topical lidocaine and Afrin were applied to the nasal cavity. After adequate anesthesia had occurred, the rigid nasal endoscope was passed into the nasal cavity. The nasal mucosa, turbinates, septum, and sinus drainage pathways were visualized bilaterally. This revealed septal perforation, minimal purulence and significant clear secretions along middle meatus, nasopharynx and evidence of surgical changes (right sided maxillary antrostomy, evidence of inferior turbinate reduction). 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  There were no polyps but nasal mucosa was edematous and there was septal deviation with S-shaped septum worse on  the right.  The scope was then slowly withdrawn and the patient tolerated the procedure well. There were no complications or blood loss.   Studies Reviewed  FNA Path 04/10/22  Specimen Submitted:  A. THYROID, LLL, FINE NEEDLE ASPIRATION:    FINAL MICROSCOPIC DIAGNOSIS:  - Consistent with benign follicular nodule (Bethesda category II)   Thyroid U/S 03/26/22 IMPRESSION: 1. Enlarging left inferior thyroid nodule has become more hypoechoic in appearance which upgrades the lesion to TI-RADS category 4. This nodule now meets criteria to consider fine-needle aspiration biopsy. 2. Continued stability of left superior thyroid nodule dating back to April of 2018. The present study confirms approximately 5 year stability and thus benignity. No further follow-up required.  CT max/face 09/22/22 IMPRESSION: 1. 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 2. Mild mucosal disease in the right maxillary sinus. Trace fluid in the left maxillary sinus. 3. Prior right uncinectomy and septostomy or perforation.  Assessment/Plan: Encounter Diagnoses  Name Primary?   Environmental and seasonal allergies    Nasal congestion    Burning mouth syndrome    Pharyngeal or nasopharyngeal cyst    Dysfunction of both eustachian tubes Yes   Dysfunction of left eustachian tube    Xerostomia    Hearing loss of left ear, unspecified hearing loss type     Female with a history of chronic nasal congestion and prior sinus surgery, many years ago, currently on saline rinses with peroxide as needed, here for multiple complaints including nasal congestion postnasal drainage and decreased hearing on the left side.  She reports  that hearing is muffled and she first noticed it 5 months ago, received a steroid taper from PCP, states there was no change in her symptoms.  No prior hearing test.  No dizziness or vertigo.  Previously given prescription for Xyzal and azelastine, but  currently not doing anything for nasal congestion aside from nasal saline rinses.  Also reports with appears to be a burning syndrome that following along the patch of her dorsal tongue.   On my exam there is evidence of postsurgical changes no recurrent polyposis, but there was significant nasal mucosal edema and somewhat purulent secretions throughout the nasal passages and in the nasopharynx.  She had a septal perforation as well as mild septal deviation and mucosal edema.  There was a prominent patchy mucosa adenoid hypertrophy versus nasopharyngeal cyst in the midline of the nasopharynx, but no masses near the eustachian tube opening.  We discussed exam findings and the need for hearing test to assess decreased hearing.  DDx includes hearing loss from [facility] versus as SNHL vs versus eustachian tube dysfunction in the setting of chronic allergies and nasal congestion.  Will also obtain CT of the sinuses max face area, to rule out chronic sinus inflammation and better evaluate nasopharyngeal cyst.  I discussed that her tongue sensation is likely attributed to burning mouth syndrome and recommended to check vitamin levels with PCP during her next follow-up.  I also advised her to start multivitamin supplementation and try biotin and increasing hydration.  I did not note any lesions or mucosal changes on her tongue exam, but the area of concern appears to be dry.  We will also initiate mometasone nasal rinses.  She will return after testing.  - mometasone nasal rinses  - CT max/face  - Audio - RTC after testing   Update 10/14/22  Decreased hearing left side in the setting of nasal congestion and suspected eustachian tube dysfunction -Unfortunately she was not able to schedule audiogram due to high cost and lack of insurance coverage for this particular test -She would like to defer testing at this time because of the high expense of imaging were performed -I again encouraged the patient to continue  Clarinex 5 mg daily and Flonase 2 puffs twice daily bilateral nares -I explained that in order to fully assess her hearing complaints we would need to see results of the hearing test  Eustachian tube dysfunction/environmental seasonal allergies/nasal congestion/septal deviation inferior turban hypertrophy and mucosal edema on nasal endoscopy during her initial office visit -She discontinued mometasone nasal rinses after 30 days due to lack of improvement -Resume Flonase twice daily 2 puffs bilateral nares and Clarinex 5 mg daily  3.  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 on nasal endoscopy -No evidence of paranasal sinus inflammation on the scan -with evidence of previous right-sided maxillary antrostomy -I advised the patient to continue nasal saline rinses even if she is not using mometasone with the saline rinses -No indication for revision FESS  -CT scan also revealed septal perforation which was previously seen on nasal endoscopy -I explained to the patient that if we were to pursue any procedural interventions septoplasty will increase chances of larger septal perforation and unlikely to be beneficial in her case -Will consider inferior turbinate reduction if she fails maximal medical management  RTC 4 months to check  on sx and for repeat exam    Thank you for allowing me to participate in the care of this patient. Please do not hesitate to contact me with any questions or concerns.   Ashok Croon, MD Otolaryngology [facility] Phone: [phone] Fax: [phone]    10/15/2022, 6:29 AM

## 2022-10-14 NOTE — Patient Instructions (Signed)

## 2022-12-17 ENCOUNTER — Encounter: Payer: Self-pay | Admitting: Family Medicine

## 2023-01-17 IMAGING — MG MM DIGITAL SCREENING BILAT W/ TOMO AND CAD
8 series · 8 of 24 positions shown · non-contrast
Comparison: None available.

CLINICAL DATA: Screening.

EXAM:
DIGITAL SCREENING BILATERAL MAMMOGRAM WITH TOMOSYNTHESIS AND CAD
TECHNIQUE: Bilateral screening digital craniocaudal and mediolateral oblique
mammograms were obtained. Bilateral screening digital breast
tomosynthesis was performed. The images were evaluated with
computer-aided detection.

[R MLO synth-2D]
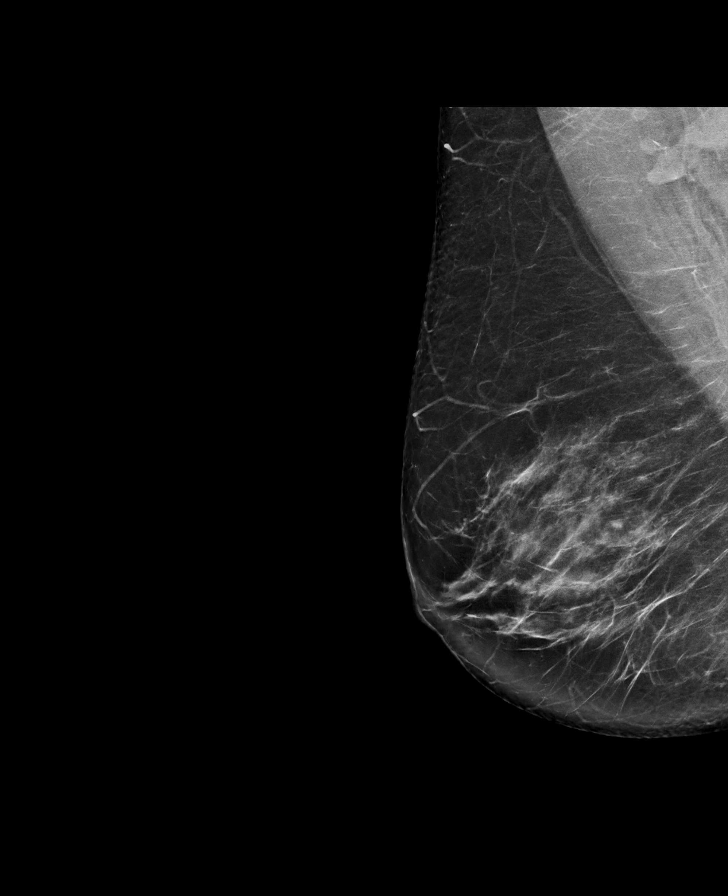

[L MLO synth-2D]
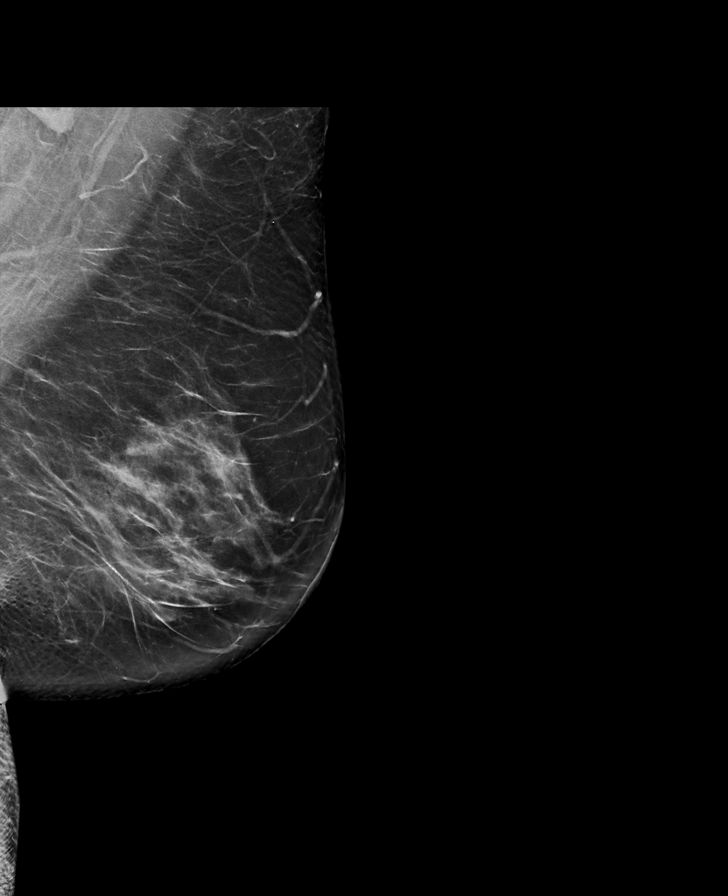

[R CC synth-2D]
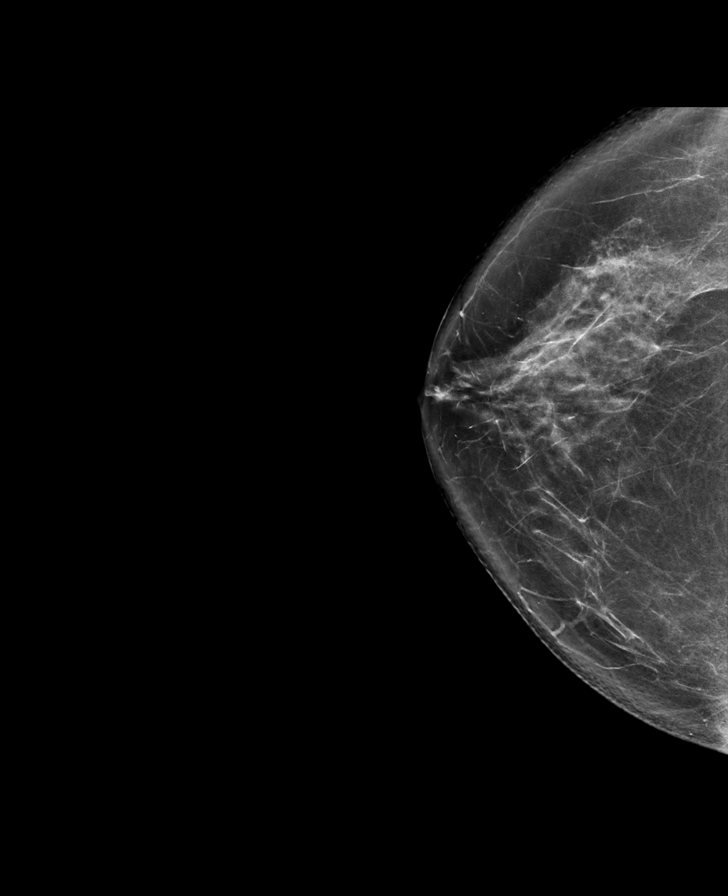

[L CC synth-2D]
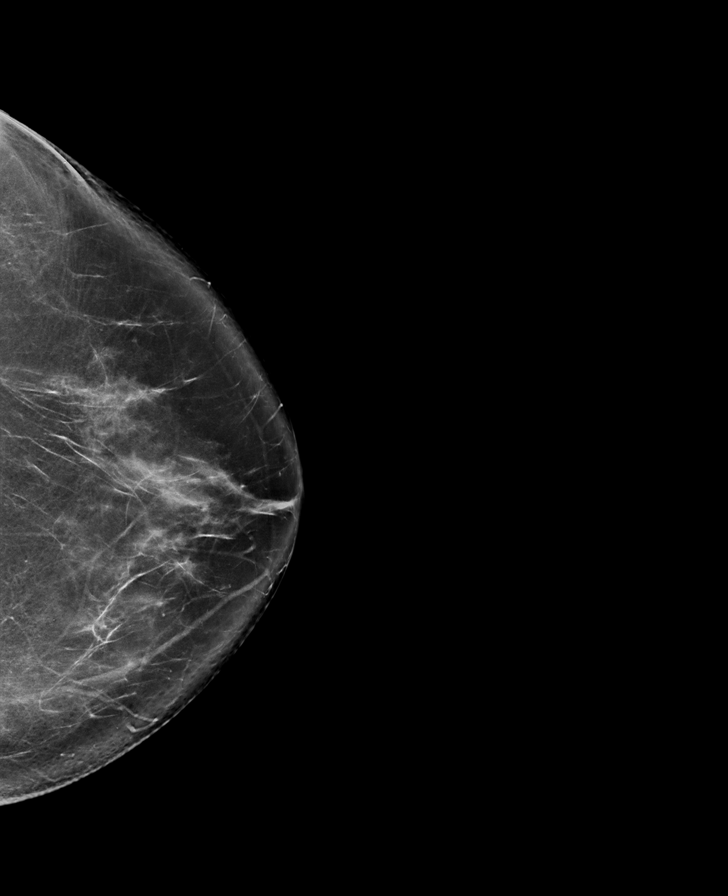

[L CC tomo · tomo slice 45/89.0]
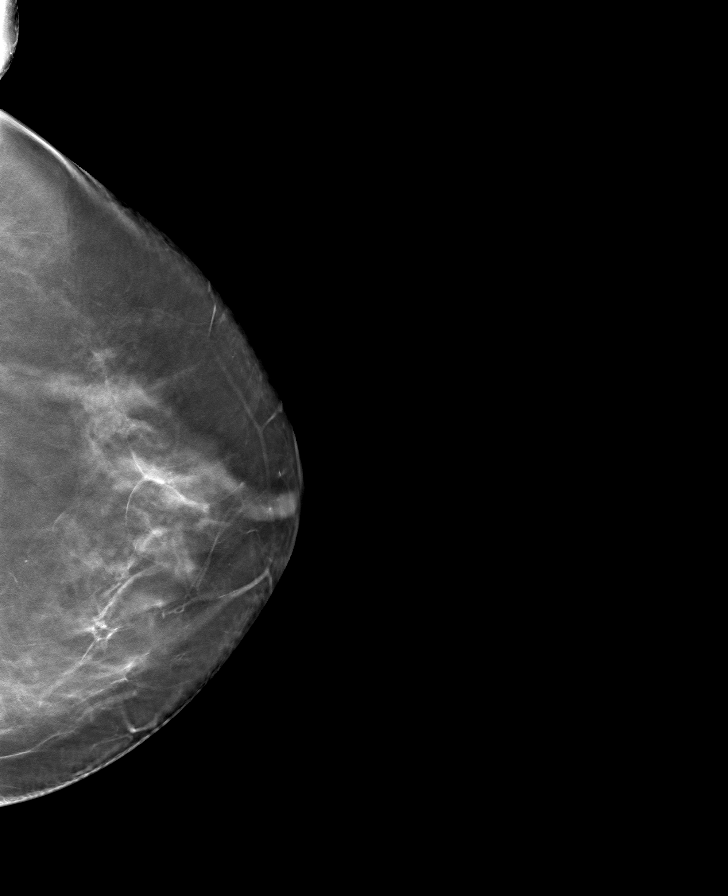

[R MLO tomo · tomo slice 45/89.0]
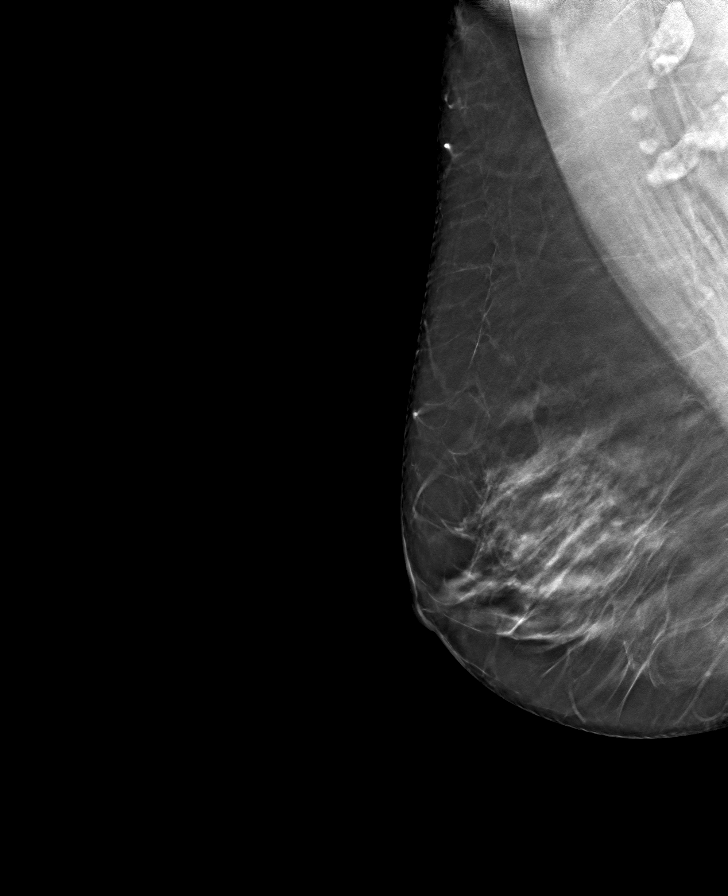

[L MLO tomo · tomo slice 44/87.0]
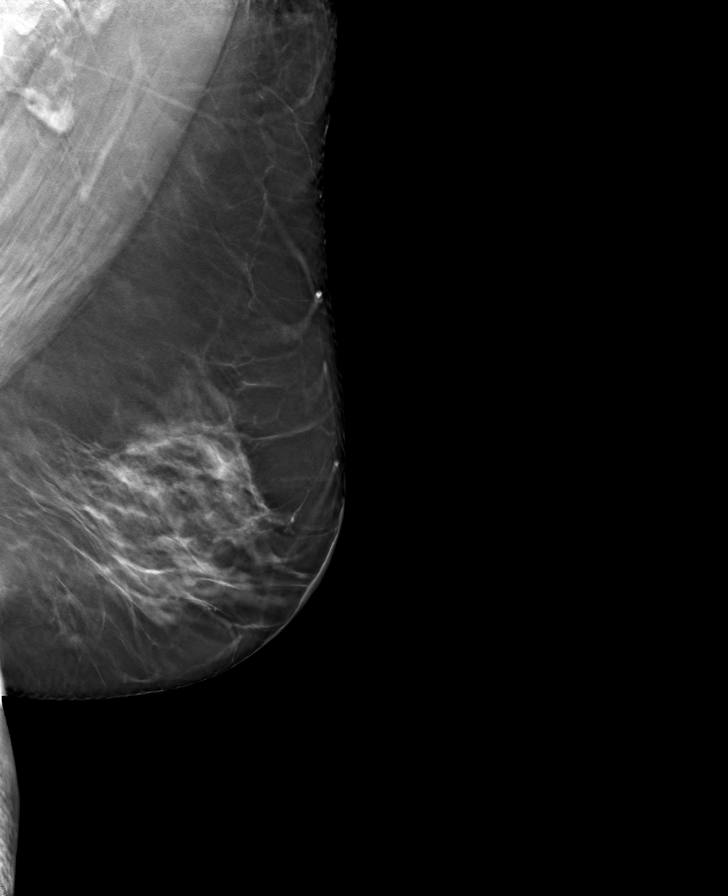

[R CC tomo · tomo slice 45/88.0]
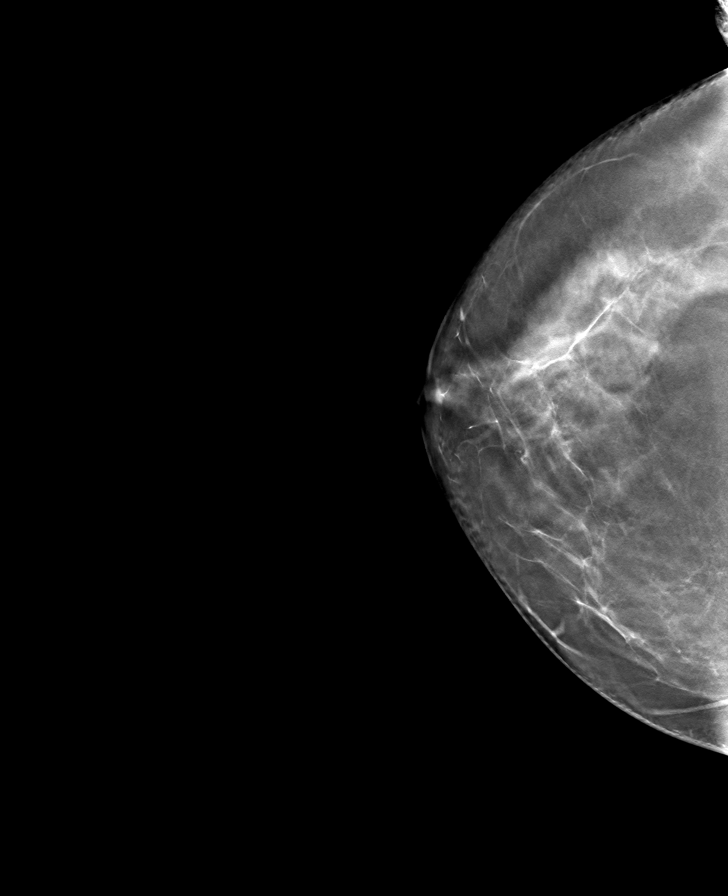

[8 of 24 positions shown; findings below may reference images not displayed]

ACR Breast Density Category b: There are scattered areas of
fibroglandular density.
FINDINGS: There are no findings suspicious for malignancy.
IMPRESSION: No mammographic evidence of malignancy. A result letter of this
screening mammogram will be mailed directly to the patient.

RECOMMENDATION:
Screening mammogram in one year. (Code:GB-Z-FIU)

BI-RADS CATEGORY  1: Negative.

## 2023-02-22 ENCOUNTER — Ambulatory Visit (INDEPENDENT_AMBULATORY_CARE_PROVIDER_SITE_OTHER): Payer: No Typology Code available for payment source | Admitting: Otolaryngology

## 2023-02-22 ENCOUNTER — Encounter (INDEPENDENT_AMBULATORY_CARE_PROVIDER_SITE_OTHER): Payer: Self-pay | Admitting: Otolaryngology

## 2023-02-22 VITALS — BP 126/83 | HR 78

## 2023-02-22 DIAGNOSIS — H9392 Unspecified disorder of left ear: Secondary | ICD-10-CM | POA: Diagnosis not present

## 2023-02-22 DIAGNOSIS — H9192 Unspecified hearing loss, left ear: Secondary | ICD-10-CM

## 2023-02-22 DIAGNOSIS — R0981 Nasal congestion: Secondary | ICD-10-CM | POA: Diagnosis not present

## 2023-02-22 DIAGNOSIS — Z9109 Other allergy status, other than to drugs and biological substances: Secondary | ICD-10-CM

## 2023-02-22 DIAGNOSIS — J3089 Other allergic rhinitis: Secondary | ICD-10-CM

## 2023-02-22 DIAGNOSIS — K146 Glossodynia: Secondary | ICD-10-CM

## 2023-02-22 DIAGNOSIS — M26622 Arthralgia of left temporomandibular joint: Secondary | ICD-10-CM

## 2023-02-22 DIAGNOSIS — H6993 Unspecified Eustachian tube disorder, bilateral: Secondary | ICD-10-CM

## 2023-02-22 MED ORDER — FLUTICASONE PROPIONATE 50 MCG/ACT NA SUSP: 2.0000 | Freq: Every day | NASAL | 6 refills | Status: DC

## 2023-02-22 MED ORDER — CETIRIZINE HCL 10 MG PO TABS: 10.0000 mg | ORAL_TABLET | Freq: Every day | ORAL | 11 refills | Status: DC

## 2023-02-22 NOTE — Progress Notes (Signed)
ENT Progress Note:  Update 02/22/23: Discussed the use of AI scribe software for clinical note transcription with the patient, who gave verbal consent to proceed.  History of Present Illness   Jennifer Schwartz is a 63 year old female who presents with left ear pressure and hearing issues. She has previously decided not to undergo hearing evaluation 2/2 cost of audiologic tests.  She experiences persistent pressure in her left ear, which sometimes causes pain and feels tight and swollen, particularly between her ear and angle of the mandible. The sensation is described as if there's pressure building. Temporary relief is noted when yawning, as it causes the ear to pop slightly, but the relief is short-lived.  She has a history of chronic nasal congestion and a septal perforation. Nasal rinses with saline help clear her nasal passages, but do not alleviate the ear pressure. No current nasal congestion is reported.  She previously stopped using an allergy pill due to adverse effects, including a burning sensation in her mouth and a swollen tongue. She has not been using Flonase recently. She has used over-the-counter Zyrtec in the past.  She acknowledges probable teeth clenching at night, which may contribute to tenderness and pressure around left TMJ joint.     Update 10/14/22: She returns after imaging (CT Max/Face).  Unfortunately due to lack of insurance coverage and high deductible she was not able to complete hearing evaluation.  She did mometasone nasal rinses for a month and then stopped due to lack of symptom improvement.  She continues to have nasal congestion.  She states today that her hearing is adequate but she still has muffled hearing, which is more pronounced on the left side.   Initial Evaluation 08/14/22  Reason for Consult: nasal congestion, hx of nasal polyps, decreased hearing on the left x 5 months, dry mouth/tongue    HPI: Jennifer Schwartz is an 63 y.o. female with history of  chronic sinusitis and nasal polyps in the past previously requiring sinus surgery with Caldwell-Luc approach and many years ago, history of allergies and nasal congestion, who is here for nasal congestion postnasal drainage and 5 months of decreased hearing on the left side described as muffled hearing.  He also reports a patch over her dorsal tongue that appears to be sensitive and somewhat painful.  - nasal congestion -previously started on Xyzal and azelastine by PCP, reports she did not tolerate Xyzal.  She uses saline rinses for her nasal congestion with a small amount of peroxide, and regimen recommended by her sinus surgeon several years ago.  Reports good sense of smell.  No recent sinus infections requiring antibiotics. -Her decreased hearing on the left side is relatively new for her, and she states that her hearing was completely normal up until 5 months ago when she noticed she could not hear out of the left ear, no tinnitus.  She feels her left ear hearing is muffled and it is 80% less than in the past.  No vertigo, gets nauseated when looking up at times and feels off balance for a few seconds from time to time. No hx of asthma or allergies to ASA/NSAIDs.  -She reports that the small area of her dorsal tongue appears to be sensitive and painful when she eats foods that are acidic.  Denies numbness or tingling or paresthesias.  Not on multivitamin currently.   Records Reviewed:  Hx of thyroid nodules reportedly benign (Had FNA)    History reviewed. No pertinent past medical history.  Past Surgical History:  Procedure Laterality Date   NASAL POLYPS  1988 AND 1998    Family History  Problem Relation Age of Onset   Diabetes Mother    Stroke Mother    Heart disease Mother    Heart disease Father    Hypertension Father    Breast cancer Neg Hx     Social History:  reports that she has been smoking cigarettes. She has never used smokeless tobacco. She reports that she does not drink  alcohol and does not use drugs.  Allergies:  Allergies  Allergen Reactions   Desloratadine Swelling    Tongue swelling, no taste    Medications: I have reviewed the patient's current medications.  The PMH, PSH, Medications, Allergies, and SH were reviewed and updated.  ROS: Constitutional: Negative for fever, weight loss and weight gain. Cardiovascular: Negative for chest pain and dyspnea on exertion. Respiratory: Is not experiencing shortness of breath at rest. Gastrointestinal: Negative for nausea and vomiting. Neurological: Negative for headaches. Psychiatric: The patient is not nervous/anxious  Blood pressure 126/83, pulse 78, last menstrual period 02/25/2011, SpO2 99%.  PHYSICAL EXAM:  Exam: General: Well-developed, well-nourished Respiratory Respiratory effort: Equal inspiration and expiration without stridor Cardiovascular Peripheral Vascular: Warm extremities with equal color/perfusion Eyes: No nystagmus with equal extraocular motion bilaterally Neuro/Psych/Balance: Patient oriented to person, place, and time; Appropriate mood and affect; Gait is intact with no imbalance; Cranial nerves I-XII are intact Head and Face Inspection: Normocephalic and atraumatic without mass or lesion Palpation: Facial skeleton intact without bony stepoffs Salivary Glands: No mass or tenderness Facial Strength: Facial motility symmetric and full bilaterally ENT Pinna: External ear intact and fully developed EAC and TM: normal bilaterally  External Nose: No scar or anatomic deformity Nasopharynx: see procedure note  TMJ - tender on the left side, no trismus Neck Neck and Trachea: Midline trachea without mass or lesion  Procedure performed during office visit on 08/20/2022:   PROCEDURE NOTE: nasal endoscopy  Preoperative diagnosis: chronic sinusitis symptoms  Postoperative diagnosis: same  Procedure: Diagnostic nasal endoscopy (16109)  Surgeon: Ashok Croon,  M.D.  Anesthesia: Topical lidocaine and Afrin  H&P REVIEW: The patient's history and physical were reviewed today prior to procedure. All medications were reviewed and updated as well. Complications: None Condition is stable throughout exam Indications and consent: The patient presents with symptoms of chronic sinusitis not responding to previous therapies. All the risks, benefits, and potential complications were reviewed with the patient preoperatively and informed consent was obtained. The time out was completed with confirmation of the correct procedure.   Procedure: The patient was seated upright in the clinic. Topical lidocaine and Afrin were applied to the nasal cavity. After adequate anesthesia had occurred, the rigid nasal endoscope was passed into the nasal cavity. The nasal mucosa, turbinates, septum, and sinus drainage pathways were visualized bilaterally. This revealed septal perforation, minimal purulence and significant clear secretions along middle meatus, nasopharynx and evidence of surgical changes (right sided maxillary antrostomy, evidence of inferior turbinate reduction). 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  There were no polyps but nasal mucosa was edematous and there was septal deviation with S-shaped septum worse on the right.  The scope was then slowly withdrawn and the patient tolerated the procedure well. There were no complications or blood loss.   Studies Reviewed  FNA Path 04/10/22  Specimen Submitted:  A. THYROID, LLL, FINE NEEDLE ASPIRATION:  FINAL MICROSCOPIC DIAGNOSIS:  - Consistent with benign follicular nodule (Bethesda category II)   Thyroid U/S 03/26/22 IMPRESSION: 1. Enlarging left inferior thyroid nodule has become more hypoechoic in appearance which upgrades the lesion to TI-RADS category 4. This nodule now meets criteria to consider fine-needle  aspiration biopsy. 2. Continued stability of left superior thyroid nodule dating back to April of 2018. The present study confirms approximately 5 year stability and thus benignity. No further follow-up required.  CT max/face 09/22/22 IMPRESSION: 1. 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 2. Mild mucosal disease in the right maxillary sinus. Trace fluid in the left maxillary sinus. 3. Prior right uncinectomy and septostomy or perforation.  Assessment/Plan: Encounter Diagnoses  Name Primary?   Environmental and seasonal allergies    Dysfunction of both eustachian tubes    Chronic nasal congestion    Burning mouth syndrome    Arthralgia of left temporomandibular joint Yes   Environmental allergies    Hearing loss of left ear, unspecified hearing loss type      Female with a history of chronic nasal congestion and prior sinus surgery, many years ago, currently on saline rinses with peroxide as needed, here for multiple complaints including nasal congestion postnasal drainage and decreased hearing on the left side.  She reports that hearing is muffled and she first noticed it 5 months ago, received a steroid taper from PCP, states there was no change in her symptoms.  No prior hearing test.  No dizziness or vertigo.  Previously given prescription for Xyzal and azelastine, but currently not doing anything for nasal congestion aside from nasal saline rinses.  Also reports with appears to be a burning syndrome that following along the patch of her dorsal tongue.   On my exam there is evidence of postsurgical changes no recurrent polyposis, but there was significant nasal mucosal edema and somewhat purulent secretions throughout the nasal passages and in the nasopharynx.  She had a septal perforation as well as mild septal deviation and mucosal edema.  There was a prominent patchy mucosa adenoid hypertrophy versus nasopharyngeal cyst in the midline of the  nasopharynx, but no masses near the eustachian tube opening.  We discussed exam findings and the need for hearing test to assess decreased hearing.  DDx includes hearing loss from [facility] versus as SNHL vs versus eustachian tube dysfunction in the setting of chronic allergies and nasal congestion.  Will also obtain CT of the sinuses max face area, to rule out chronic sinus inflammation and better evaluate nasopharyngeal cyst.  I discussed that her tongue sensation is likely attributed to burning mouth syndrome and recommended to check vitamin levels with PCP during her next follow-up.  I also advised her to start multivitamin supplementation and try biotin and increasing hydration.  I did not note any lesions or mucosal changes on her tongue exam, but the area of concern appears to be dry.  We will also initiate mometasone nasal rinses.  She will return after testing.  - mometasone nasal rinses  - CT max/face  - Audio - RTC after testing   Update 10/14/22  Decreased hearing left side in the setting of nasal congestion and suspected eustachian tube dysfunction -Unfortunately she was not able to schedule audiogram due to high cost and lack of insurance coverage for this particular test -She would like to defer testing at this time because of the high expense of imaging were performed -I again encouraged the patient to continue Clarinex  5 mg daily and Flonase 2 puffs twice daily bilateral nares -I explained that in order to fully assess her hearing complaints we would need to see results of the hearing test  Eustachian tube dysfunction/environmental seasonal allergies/nasal congestion/septal deviation inferior turban hypertrophy and mucosal edema on nasal endoscopy during her initial office visit -She discontinued mometasone nasal rinses after 30 days due to lack of improvement -Resume Flonase twice daily 2 puffs bilateral nares and Clarinex 5 mg daily  3.  Nasal congestion/nasal obstruction/nasal drainage  and eustachian tube dysfunction/last exam with evidence of asymmetry versus adenoid hypertrophy along the nasopharyngeal wall -I reviewed CT max face performed following her initial evaluation -evidence of Thornwaldt cyst which explains asymmetry seen on nasal endoscopy -No evidence of paranasal sinus inflammation on the scan -with evidence of previous right-sided maxillary antrostomy -I advised the patient to continue nasal saline rinses even if she is not using mometasone with the saline rinses -No indication for revision FESS  -CT scan also revealed septal perforation which was previously seen on nasal endoscopy -I explained to the patient that if we were to pursue any procedural interventions septoplasty will increase chances of larger septal perforation and unlikely to be beneficial in her case -Will consider inferior turbinate reduction if she fails maximal medical management  RTC 4 months to check on sx and for repeat exam   Update 02/22/23 Assessment and Plan    Eustachian Tube Dysfunction Chronic fullness and pressure in the left ear, likely related to Eustachian tube dysfunction vs TMJ sy. Symptoms include intermittent partial relief with yawning. No evidence of ear infection or air fluid level on exam today. She had nasal endoscopy in August of 2024, without masses or tumors near eustachian tube opening. Previous treatments include nasal saline rinses and steroid sprays. History of mini sinus surgery on both sides several years ago. Discussed potential benefits and risks of Eustachian tube dilation and ear tube placement. Patient prefers to avoid invasive procedures at this time. She also would like to hold off on hearing test, due to cost of testing. I offered an assessment at [facility] as an alternative and she will look into it.  - Restart Flonase nasal spray 2 puffs b/l nares - Start Zyrtec (cetirizine) daily 10 mg - Follow up in four months to reassess symptoms - Consider Eustachian  tube dilation if symptoms persist - Consider adding Sudafed for a few days if Zyrtec is ineffective or if her nasal congestion gets worse  Chronic Nasal Congestion Chronic nasal congestion potentially contributing to Eustachian tube dysfunction. Discontinued Flonase and allergy pills due to adverse effects. Currently using saline rinses. Discussed alternative allergy medications and the importance of managing nasal congestion to alleviate Eustachian tube dysfunction. 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 Symptoms may be contributing to the sensation of fullness in the ear. Discussed potential TMJ management. - Consider TMJ as a contributing factor to ear fullness - Evaluate for potential TMJ management if symptoms persist - will refer to Oral Surgery   General Health Maintenance Patient declined a hearing test due to cost concerns. Advised on alternative options for hearing tests at [facility], which may be less expensive. - Consider hearing test at [facility] if symptoms persist  Follow-up - Follow up in four months to reassess symptoms.   I spent 30 minutes in total face-to-face time and in reviewing  records during pre-charting, more than 50% of which was spent in counseling and coordination of care, reviewing test results, reviewing medications and treatment regimen and/or in discussing or reviewing the diagnosis, the prognosis and treatment options. Pertinent laboratory and imaging test results that were available during this visit with the patient were reviewed by me and considered in my medical decision making (see chart for details).      Ashok Croon, MD Otolaryngology [facility] Phone: [phone] Fax: [phone]    02/23/2023, 6:26 PM

## 2023-05-07 ENCOUNTER — Other Ambulatory Visit: Payer: Self-pay | Admitting: Family Medicine

## 2023-05-07 DIAGNOSIS — H6992 Unspecified Eustachian tube disorder, left ear: Secondary | ICD-10-CM

## 2023-05-07 NOTE — Telephone Encounter (Signed)
 Copied from CRM [id number]. Topic: Clinical - Medication Refill >> May 07, 2023  8:13 AM Juluis Ok wrote: Most Recent Primary Care Visit:   Medication: predniSONE  (STERAPRED UNI-PAK 21 TAB) 10 MG (21) TBPK tablet  Has the patient contacted their pharmacy? Yes (Agent: If no, request that the patient contact the pharmacy for the refill. If patient does not wish to contact the pharmacy document the reason why and proceed with request.) (Agent: If yes, when and what did the pharmacy advise?)  Is this the correct pharmacy for this prescription? Yes If no, delete pharmacy and type the correct one.  This is the patient's preferred pharmacy:  [id number] - [place], [place] - [address] [place] [address] Phone: [phone] Fax: [phone]   Has the prescription been filled recently? No  Is the patient out of the medication? Yes  Has the patient been seen for an appointment in the last year OR does the patient have an upcoming appointment? Yes  Can we respond through MyChart? No  Agent: Please be advised that Rx refills may take up to 3 business days. We ask that you follow-up with your pharmacy.

## 2023-06-21 ENCOUNTER — Ambulatory Visit (INDEPENDENT_AMBULATORY_CARE_PROVIDER_SITE_OTHER): Payer: No Typology Code available for payment source | Admitting: Otolaryngology

## 2023-08-16 ENCOUNTER — Other Ambulatory Visit (HOSPITAL_COMMUNITY)
Admission: RE | Admit: 2023-08-16 | Discharge: 2023-08-16 | Disposition: A | Discharge status: Home / Self Care | Source: Ambulatory Visit | Attending: Family Medicine | Admitting: Family Medicine

## 2023-08-16 ENCOUNTER — Encounter: Payer: Self-pay | Admitting: Family Medicine

## 2023-08-16 ENCOUNTER — Ambulatory Visit: Admitting: Family Medicine

## 2023-08-16 VITALS — BP 116/78 | HR 84 | Temp 98.3°F | Ht 67.0 in | Wt 195.1 lb

## 2023-08-16 DIAGNOSIS — E7800 Pure hypercholesterolemia, unspecified: Secondary | ICD-10-CM

## 2023-08-16 DIAGNOSIS — F17200 Nicotine dependence, unspecified, uncomplicated: Secondary | ICD-10-CM | POA: Diagnosis not present

## 2023-08-16 DIAGNOSIS — E559 Vitamin D deficiency, unspecified: Secondary | ICD-10-CM | POA: Diagnosis not present

## 2023-08-16 DIAGNOSIS — H6992 Unspecified Eustachian tube disorder, left ear: Secondary | ICD-10-CM

## 2023-08-16 DIAGNOSIS — Z124 Encounter for screening for malignant neoplasm of cervix: Secondary | ICD-10-CM

## 2023-08-16 DIAGNOSIS — Z1211 Encounter for screening for malignant neoplasm of colon: Secondary | ICD-10-CM

## 2023-08-16 DIAGNOSIS — D1803 Hemangioma of intra-abdominal structures: Secondary | ICD-10-CM

## 2023-08-16 DIAGNOSIS — Z0000 Encounter for general adult medical examination without abnormal findings: Secondary | ICD-10-CM

## 2023-08-16 DIAGNOSIS — Z0001 Encounter for general adult medical examination with abnormal findings: Secondary | ICD-10-CM

## 2023-08-16 LAB — LIPID PANEL

## 2023-08-16 MED ORDER — VARENICLINE TARTRATE 1 MG PO TABS: 1.0000 mg | ORAL_TABLET | Freq: Two times a day (BID) | ORAL | 2 refills | Status: AC

## 2023-08-16 MED ORDER — VARENICLINE TARTRATE (STARTER) 0.5 MG X 11 & 1 MG X 42 PO TBPK: ORAL_TABLET | ORAL | 0 refills | Status: DC

## 2023-08-16 NOTE — Progress Notes (Signed)
 Jennifer Schwartz is a 63 y.o. female presents to office today for annual physical exam examination.    Concerns today include: 1.  None.  She actually needs a physical.  No medications are needed refills on.  She wants to start back on Chantix  that she continues to be a 1 pack/day smoker.  Denies any hemoptysis, unplanned weight loss, night sweats, chest pain or shortness of breath.  Continues to have difficulty with eustachian tube dysfunction and is seeing ENT for this but none of the medications or treatments have been working so far.  Since her last visit dad developed lung cancer.  He is a former smoker but she attributes his lung cancer to COVID vaccination.  Also wants to be referred to gastroenterology and denies any GI symptoms.  Occasionally has some pelvic pain that is intermittent and occurs at random, often during the nighttime.  It feels sharp and stabbing in the vaginal/perineal area and then resolves independently about 15 minutes after  Occupation: Continues to work, Marital status: Married, Substance use: Tobacco as above Health Maintenance Due  Topic Date Due   Colonoscopy  Never done   Refills needed today: Chantix   Immunization History  Administered Date(s) Administered   Tdap 07/02/2015   History reviewed. No pertinent past medical history. Social History   Socioeconomic History   Marital status: Married    Spouse name: Not on file   Number of children: Not on file   Years of education: Not on file   Highest education level: Not on file  Occupational History   Not on file  Tobacco Use   Smoking status: Every Day    Current packs/day: 0.25    Types: Cigarettes   Smokeless tobacco: Never  Vaping Use   Vaping status: Never Used  Substance and Sexual Activity   Alcohol use: Never   Drug use: Never   Sexual activity: Not on file  Other Topics Concern   Not on file  Social History Narrative   Not on file   Social Drivers of Health   Financial Resource  Strain: Not on file  Food Insecurity: Not on file  Transportation Needs: Not on file  Physical Activity: Not on file  Stress: Not on file  Social Connections: Not on file  Intimate Partner Violence: Not on file   Past Surgical History:  Procedure Laterality Date   NASAL POLYPS  1988 AND 1998   Family History  Problem Relation Age of Onset   Diabetes Mother    Stroke Mother    Heart disease Mother    Heart disease Father    Hypertension Father    Breast cancer Neg Hx     Current Outpatient Medications:    sodium chloride (OCEAN) 0.65 % SOLN nasal spray, Place 1 spray into both nostrils as needed for congestion., Disp: 30 mL, Rfl: 0   varenicline  (CHANTIX  CONTINUING MONTH PAK) 1 MG tablet, Take 1 tablet (1 mg total) by mouth 2 (two) times daily., Disp: 60 tablet, Rfl: 2   Varenicline  Tartrate, Starter, (CHANTIX  STARTING MONTH PAK) 0.5 MG X 11 & 1 MG X 42 TBPK, Take 0.5 mg tablet by mouth once daily x3 days, then 0.5 mg tablet twice daily x4 days, then increase to one 1 mg tablet twice daily., Disp: 53 each, Rfl: 0  Allergies  Allergen Reactions   Desloratadine  Swelling    Tongue swelling, no taste     ROS: Review of Systems Pertinent items noted in HPI and  remainder of comprehensive ROS otherwise negative.    Physical exam BP 116/78   Pulse 84   Temp 98.3 F (36.8 C)   Ht 5' 7 (1.702 m)   Wt 195 lb 2 oz (88.5 kg)   LMP 02/25/2011   SpO2 95%   BMI 30.56 kg/m  General appearance: alert, cooperative, appears stated age, no distress, and mildly obese Head: Normocephalic, without obvious abnormality, atraumatic Eyes: negative findings: lids and lashes normal, conjunctivae and sclerae normal, corneas clear, and pupils equal, round, reactive to light and accomodation Ears: normal TM's and external ear canals both ears Nose: Nares normal. Septum midline. Mucosa normal. No drainage or sinus tenderness. Throat: lips, mucosa, and tongue normal; teeth and gums normal Neck: no  adenopathy, no carotid bruit, supple, symmetrical, trachea midline, and thyroid  not enlarged, symmetric, no tenderness/mass/nodules Back: symmetric, no curvature. ROM normal. No CVA tenderness. Lungs: clear to auscultation bilaterally Heart: regular rate and rhythm, S1, S2 normal, no murmur, click, rub or gallop Abdomen: soft, non-tender; bowel sounds normal; no masses,  no organomegaly Extremities: extremities normal, atraumatic, no cyanosis or edema Pulses: 2+ and symmetric Skin: Skin color, texture, turgor normal. No rashes or lesions Lymph nodes: Cervical, supraclavicular, and axillary nodes normal. Neurologic: Grossly normal  GU: Atrophic external vagina.  Cervix is midline.  Moderate amounts of creamy mucus from the os.  No bleeding.     08/16/2023    2:59 PM 05/18/2022    9:45 AM 05/14/2021    4:02 PM  Depression screen PHQ 2/9  Decreased Interest 0 0 0  Down, Depressed, Hopeless 0 0 0  PHQ - 2 Score 0 0 0  Altered sleeping 0 0 0  Tired, decreased energy 2 0 0  Change in appetite 0 0 0  Feeling bad or failure about yourself  0 0 0  Trouble concentrating 0 0 0  Moving slowly or fidgety/restless 0 0 0  Suicidal thoughts 0 0 0  PHQ-9 Score 2 0 0  Difficult doing work/chores Not difficult at all Not difficult at all Not difficult at all      08/16/2023    3:00 PM 05/18/2022    9:45 AM 05/14/2021    4:03 PM 03/18/2021   10:23 AM  GAD 7 : Generalized Anxiety Score  Nervous, Anxious, on Edge 0 0 0 0  Control/stop worrying 0 0 0 0  Worry too much - different things 0 0 0 0  Trouble relaxing 0 0 0 0  Restless 0 0 0 0  Easily annoyed or irritable 0 0 0 0  Afraid - awful might happen 0 0 0 0  Total GAD 7 Score 0 0 0 0  Anxiety Difficulty Not difficult at all Not difficult at all Not difficult at all Not difficult at all     Assessment/ Plan: Jennifer Schwartz here for annual physical exam.   Annual physical exam  Screening for malignant neoplasm of cervix - Plan: Cytology -  PAP  Screen for colon cancer - Plan: Ambulatory referral to Gastroenterology  Vitamin D  deficiency - Plan: VITAMIN D  25 Hydroxy (Vit-D Deficiency, Fractures)  Tobacco use disorder - Plan: CBC with Differential, Ambulatory Referral Lung Cancer Screening [facility], Varenicline  Tartrate, Starter, (CHANTIX  STARTING MONTH PAK) 0.5 MG X 11 & 1 MG X 42 TBPK, varenicline  (CHANTIX  CONTINUING MONTH PAK) 1 MG tablet  Hemangioma of liver - Plan: CMP14+EGFR  Pure hypercholesterolemia - Plan: CMP14+EGFR, Lipid Panel, TSH  Dysfunction of left eustachian tube  Pap  smear completed.  Referral for lung cancer screening placed.  Chantix  ordered.  Referral for colon cancer screening placed.  Declines vaccinations  Nonfasting labs collected.  Continue to follow-up with ENT for eustachian tube dysfunction.  Considering chiropractic medicine for treatment.  Counseled on healthy lifestyle choices, including diet (rich in fruits, vegetables and lean meats and low in salt and simple carbohydrates) and exercise (at least 30 minutes of moderate physical activity daily).  Patient to follow up 1 year for CPE  Haydan Wedig M. Jolinda, DO

## 2023-08-16 NOTE — Patient Instructions (Signed)
 Preventive Care 16-62 Years Old, Female  Preventive care refers to lifestyle choices and visits with your health care provider that can promote health and wellness. Preventive care visits are also called wellness exams.  What can I expect for my preventive care visit?  Counseling  Your health care provider may ask you questions about your:  Medical history, including:  Past medical problems.  Family medical history.  Pregnancy history.  Current health, including:  Menstrual cycle.  Method of birth control.  Emotional well-being.  Home life and relationship well-being.  Sexual activity and sexual health.  Lifestyle, including:  Alcohol, nicotine or tobacco, and drug use.  Access to firearms.  Diet, exercise, and sleep habits.  Work and work Astronomer.  Sunscreen use.  Safety issues such as seatbelt and bike helmet use.  Physical exam  Your health care provider will check your:  Height and weight. These may be used to calculate your BMI (body mass index). BMI is a measurement that tells if you are at a healthy weight.  Waist circumference. This measures the distance around your waistline. This measurement also tells if you are at a healthy weight and may help predict your risk of certain diseases, such as type 2 diabetes and high blood pressure.  Heart rate and blood pressure.  Body temperature.  Skin for abnormal spots.  What immunizations do I need?    Vaccines are usually given at various ages, according to a schedule. Your health care provider will recommend vaccines for you based on your age, medical history, and lifestyle or other factors, such as travel or where you work.  What tests do I need?  Screening  Your health care provider may recommend screening tests for certain conditions. This may include:  Lipid and cholesterol levels.  Diabetes screening. This is done by checking your blood sugar (glucose) after you have not eaten for a while (fasting).  Pelvic exam and Pap test.  Hepatitis B test.  Hepatitis C  test.  HIV (human immunodeficiency virus) test.  STI (sexually transmitted infection) testing, if you are at risk.  Lung cancer screening.  Colorectal cancer screening.  Mammogram. Talk with your health care provider about when you should start having regular mammograms. This may depend on whether you have a family history of breast cancer.  BRCA-related cancer screening. This may be done if you have a family history of breast, ovarian, tubal, or peritoneal cancers.  Bone density scan. This is done to screen for osteoporosis.  Talk with your health care provider about your test results, treatment options, and if necessary, the need for more tests.  Follow these instructions at home:  Eating and drinking    Eat a diet that includes fresh fruits and vegetables, whole grains, lean protein, and low-fat dairy products.  Take vitamin and mineral supplements as recommended by your health care provider.  Do not drink alcohol if:  Your health care provider tells you not to drink.  You are pregnant, may be pregnant, or are planning to become pregnant.  If you drink alcohol:  Limit how much you have to 0-1 drink a day.  Know how much alcohol is in your drink. In the [place] one drink equals one 12 oz bottle of beer (355 mL), one 5 oz glass of wine (148 mL), or one 1 oz glass of hard liquor (44 mL).  Lifestyle  Brush your teeth every morning and night with fluoride toothpaste. Floss one time each day.  Exercise for at least  30 minutes 5 or more days each week.  Do not use any products that contain nicotine or tobacco. These products include cigarettes, chewing tobacco, and vaping devices, such as e-cigarettes. If you need help quitting, ask your health care provider.  Do not use drugs.  If you are sexually active, practice safe sex. Use a condom or other form of protection to prevent STIs.  If you do not wish to become pregnant, use a form of birth control. If you plan to become pregnant, see your health care provider for a  prepregnancy visit.  Take aspirin only as told by your health care provider. Make sure that you understand how much to take and what form to take. Work with your health care provider to find out whether it is safe and beneficial for you to take aspirin daily.  Find healthy ways to manage stress, such as:  Meditation, yoga, or listening to music.  Journaling.  Talking to a trusted person.  Spending time with friends and family.  Minimize exposure to UV radiation to reduce your risk of skin cancer.  Safety  Always wear your seat belt while driving or riding in a vehicle.  Do not drive:  If you have been drinking alcohol. Do not ride with someone who has been drinking.  When you are tired or distracted.  While texting.  If you have been using any mind-altering substances or drugs.  Wear a helmet and other protective equipment during sports activities.  If you have firearms in your house, make sure you follow all gun safety procedures.  Seek help if you have been physically or sexually abused.  What's next?  Visit your health care provider once a year for an annual wellness visit.  Ask your health care provider how often you should have your eyes and teeth checked.  Stay up to date on all vaccines.  This information is not intended to replace advice given to you by your health care provider. Make sure you discuss any questions you have with your health care provider.  Document Revised: 06/26/2020 Document Reviewed: 06/26/2020  Elsevier Patient Education  2024 [employer].

## 2023-08-17 ENCOUNTER — Ambulatory Visit: Payer: Self-pay | Admitting: Family Medicine

## 2023-08-17 ENCOUNTER — Encounter (INDEPENDENT_AMBULATORY_CARE_PROVIDER_SITE_OTHER): Payer: Self-pay | Admitting: *Deleted

## 2023-08-17 ENCOUNTER — Other Ambulatory Visit: Payer: Self-pay | Admitting: Family Medicine

## 2023-08-17 DIAGNOSIS — E7800 Pure hypercholesterolemia, unspecified: Secondary | ICD-10-CM

## 2023-08-17 DIAGNOSIS — F17200 Nicotine dependence, unspecified, uncomplicated: Secondary | ICD-10-CM

## 2023-08-17 LAB — CMP14+EGFR
ALT: 12 IU/L (ref 0–32)
AST: 17 IU/L (ref 0–40)
Albumin: 4 g/dL (ref 3.9–4.9)
Alkaline Phosphatase: 122 IU/L — AB (ref 44–121)
BUN/Creatinine Ratio: 16 (ref 12–28)
BUN: 12 mg/dL (ref 8–27)
Bilirubin Total: 0.4 mg/dL (ref 0.0–1.2)
CO2: 22 mmol/L (ref 20–29)
Calcium: 9.1 mg/dL (ref 8.7–10.3)
Chloride: 105 mmol/L (ref 96–106)
Creatinine, Ser: 0.73 mg/dL (ref 0.57–1.00)
Globulin, Total: 2.4 g/dL (ref 1.5–4.5)
Glucose: 109 mg/dL — AB (ref 70–99)
Potassium: 4.1 mmol/L (ref 3.5–5.2)
Sodium: 139 mmol/L (ref 134–144)
Total Protein: 6.4 g/dL (ref 6.0–8.5)
eGFR: 94 mL/min/1.73 (ref >59)

## 2023-08-17 LAB — CBC WITH DIFFERENTIAL/PLATELET
Basophils Absolute: 0 x10E3/uL (ref 0.0–0.2)
Basos: 1 %
EOS (ABSOLUTE): 0.2 x10E3/uL (ref 0.0–0.4)
Eos: 3 %
Hematocrit: 39.1 % (ref 34.0–46.6)
Hemoglobin: 12.8 g/dL (ref 11.1–15.9)
Immature Grans (Abs): 0 x10E3/uL (ref 0.0–0.1)
Immature Granulocytes: 0 %
Lymphocytes Absolute: 2 x10E3/uL (ref 0.7–3.1)
Lymphs: 32 %
MCH: 29.4 pg (ref 26.6–33.0)
MCHC: 32.7 g/dL (ref 31.5–35.7)
MCV: 90 fL (ref 79–97)
Monocytes Absolute: 0.4 x10E3/uL (ref 0.1–0.9)
Monocytes: 6 %
Neutrophils Absolute: 3.7 x10E3/uL (ref 1.4–7.0)
Neutrophils: 58 %
Platelets: 332 x10E3/uL (ref 150–450)
RBC: 4.36 x10E6/uL (ref 3.77–5.28)
RDW: 14.2 % (ref 11.7–15.4)
WBC: 6.3 x10E3/uL (ref 3.4–10.8)

## 2023-08-17 LAB — LIPID PANEL
Cholesterol, Total: 170 mg/dL (ref 100–199)
HDL: 49 mg/dL (ref >39)
LDL CALC COMMENT:: 3.5 ratio (ref 0.0–4.4)
LDL Chol Calc (NIH): 102 mg/dL — AB (ref 0–99)
Triglycerides: 107 mg/dL (ref 0–149)
VLDL Cholesterol Cal: 19 mg/dL (ref 5–40)

## 2023-08-17 LAB — VITAMIN D 25 HYDROXY (VIT D DEFICIENCY, FRACTURES): Vit D, 25-Hydroxy: 31 ng/mL (ref 30.0–100.0)

## 2023-08-17 LAB — TSH: TSH: 0.959 u[IU]/mL (ref 0.450–4.500)

## 2023-08-18 LAB — HGB A1C W/O EAG: Hgb A1c MFr Bld: 5.6 % (ref 4.8–5.6)

## 2023-08-18 LAB — SPECIMEN STATUS REPORT

## 2023-08-23 LAB — CYTOLOGY - PAP
Adequacy: ABSENT
Comment: NEGATIVE
Diagnosis: NEGATIVE
High risk HPV: NEGATIVE

## 2023-08-30 ENCOUNTER — Ambulatory Visit: Payer: Self-pay | Admitting: Family Medicine

## 2023-08-30 ENCOUNTER — Ambulatory Visit (HOSPITAL_COMMUNITY)
Admission: RE | Admit: 2023-08-30 | Discharge: 2023-08-30 | Disposition: A | Discharge status: Home / Self Care | Payer: Self-pay | Source: Ambulatory Visit | Attending: Family Medicine | Admitting: Family Medicine

## 2023-08-30 DIAGNOSIS — F17200 Nicotine dependence, unspecified, uncomplicated: Secondary | ICD-10-CM | POA: Insufficient documentation

## 2023-08-30 DIAGNOSIS — E7800 Pure hypercholesterolemia, unspecified: Secondary | ICD-10-CM | POA: Insufficient documentation

## 2023-09-14 ENCOUNTER — Telehealth: Payer: Self-pay

## 2023-09-14 NOTE — Telephone Encounter (Signed)
 Who is your primary care physician: Norene Fielding  Reasons for the colonoscopy: Screening  Have you had a colonoscopy before?  No Do you have family history of polyps? Yes mother   Do you have family history of colon cancer? no  Previous colonoscopy with polyps removed? no  Do you have a history colorectal cancer?   no  Are you diabetic? If yes, Type 1 or Type 2?    no  Do you have a prosthetic or mechanical heart valve? no  Do you have a pacemaker/defibrillator?   no  Have you had endocarditis/atrial fibrillation? no  Have you had joint replacement within the last 12 months? Not listed  Do you tend to be constipated or have to use laxatives? Not listed  Do you use supplemental oxygen?  no  Have you had a stroke or heart attack within the last 6 months? Not listed  Do you take weight loss medication?  no  For female patients: have you had a hysterectomy?  no                                     are you post menopausal?       yes                                            do you still have your menstrual cycle? no    7 yrs ago  Do you take any blood-thinning medications such as: (aspirin, warfarin, Plavix, Aggrenox)  no  If yes we need the name, milligram, dosage and who is prescribing doctor  Current Outpatient Medications on File Prior to Visit  Medication Sig Dispense Refill   varenicline  (CHANTIX  CONTINUING MONTH PAK) 1 MG tablet Take 1 tablet (1 mg total) by mouth 2 (two) times daily. 60 tablet 2   No current facility-administered medications on file prior to visit.    Allergies  Allergen Reactions   Desloratadine  Swelling    Tongue swelling, no taste     Pharmacy: [facility]  Primary Insurance Name: [employer] [id number]  Best number where you can be reached: [phone]

## 2023-10-08 NOTE — Telephone Encounter (Signed)
ASA 2.  ?

## 2023-10-11 ENCOUNTER — Ambulatory Visit: Payer: Self-pay

## 2023-10-11 ENCOUNTER — Encounter (INDEPENDENT_AMBULATORY_CARE_PROVIDER_SITE_OTHER): Payer: Self-pay | Admitting: *Deleted

## 2023-10-11 MED ORDER — PEG 3350-KCL-NA BICARB-NACL 420 G PO SOLR: 4000.0000 mL | Freq: Once | ORAL | 0 refills | Status: AC

## 2023-10-11 NOTE — Addendum Note (Signed)
 Addended by: JEANELL GRAEME RAMAN on: 10/11/2023 10:15 AM   Modules accepted: Orders

## 2023-10-11 NOTE — Telephone Encounter (Signed)
 Referral completed, TCS apt letter sent to PCP

## 2023-10-11 NOTE — Telephone Encounter (Signed)
 Spoke with pt. Scheduled for 10/9 with Dr. Shaaron. Aware will send rx for prep to pharmacy and instructions in the mail and mychart. She also stated her PCP was supposed to send a referral for her liver but advised I only see a referral placed for screening TCS. So if she needs to be seen for liver, PCP will need to send a referral. She voiced understanding.

## 2023-10-11 NOTE — Telephone Encounter (Signed)
 FYI Only or Action Required?: Action required by provider: clinical question for provider.  Patient was last seen in primary care on 08/16/2023 by Jolinda Norene HERO, DO.  Called Nurse Triage reporting Abdominal Pain.  Symptoms began several years ago.  Interventions attempted: Nothing.  Symptoms are: stable.  Triage Disposition: Home Care  Patient/caregiver understands and will follow disposition?: Yes   Copied from CRM [id number]. Topic: Clinical - Red Word Triage >> Oct 11, 2023 10:10 AM Rolland B wrote: [facility] that prompted transfer to Nurse Triage: Patient has been in communication with her PCP in regard to a liver screening. This referral has not been processed yet and the patient is stating she needs something for it because it currently feels like its going to explode, painful pressure and discomfort. Reason for Disposition  Abdominal pain  Answer Assessment - Initial Assessment Questions Pt states that this pressure has been going on for years. She thought she was supposed to have a referral for liver and colonoscopy. Pt requesting call back for information about liver referral.    1. LOCATION: Where does it hurt?      Right side under rib cage 2. RADIATION: Does the pain shoot anywhere else? (e.g., chest, back)     Pushes up under ribs and out back  3. ONSET: When did the pain begin? (e.g., minutes, hours or days ago)      Couple of years getting worse 4. SUDDEN: Gradual or sudden onset?     gradual 5. PATTERN Does the pain come and go, or is it constant?     Constant, intensity gets worse 6. SEVERITY: How bad is the pain?  (e.g., Scale 1-10; mild, moderate, or severe)  5-6 7. RECURRENT SYMPTOM: Have you ever had this type of stomach pain before? If Yes, ask: When was the last time? and What happened that time?      yes 8. AGGRAVATING FACTORS: Does anything seem to cause this pain? (e.g., foods, stress, alcohol)     nothing 9. CARDIAC SYMPTOMS:  Do you have any of the following symptoms: chest pain, difficulty breathing, sweating, nausea?     Sometimes feels like she can't catch her breath because the pressure pushes into her diaphragm 10. OTHER SYMPTOMS: Do you have any other symptoms? (e.g., back pain, diarrhea, fever, urination pain, vomiting)        No  Protocols used: Abdominal Pain - Upper-A-AH

## 2023-10-12 ENCOUNTER — Other Ambulatory Visit: Payer: Self-pay | Admitting: Family Medicine

## 2023-10-12 DIAGNOSIS — D1803 Hemangioma of intra-abdominal structures: Secondary | ICD-10-CM

## 2023-10-12 DIAGNOSIS — R1011 Right upper quadrant pain: Secondary | ICD-10-CM

## 2023-10-12 NOTE — Telephone Encounter (Signed)
 done

## 2023-10-12 NOTE — Telephone Encounter (Signed)
 I have addended the referral to GI from August to include the liver as part of what they are seeing her for.  Will cc our referral coordinator [place] as well

## 2023-10-12 NOTE — Telephone Encounter (Signed)
Wanted to make you aware.

## 2023-10-12 NOTE — Telephone Encounter (Signed)
 I called patient and made her aware and she voiced understanding. No other concerns/questions.

## 2023-10-12 NOTE — Telephone Encounter (Signed)
 Her gastroentereologist will take care of her liver. Looks to have an appt with them already

## 2023-10-12 NOTE — Telephone Encounter (Signed)
 I called and spoke with patient and gave her Dr Melba advise. Patient says she is scheduled to have a colonoscopy but was told by them that that's all they were doing. Does a separate referral have to be made for liver?

## 2023-10-13 NOTE — Telephone Encounter (Signed)
 New Referral sent to [facility] as requested :)

## 2023-10-21 ENCOUNTER — Ambulatory Visit (HOSPITAL_COMMUNITY)
Admission: RE | Admit: 2023-10-21 | Discharge: 2023-10-21 | Disposition: A | Discharge status: Home / Self Care | Attending: Internal Medicine | Admitting: Internal Medicine

## 2023-10-21 ENCOUNTER — Ambulatory Visit (HOSPITAL_COMMUNITY)

## 2023-10-21 ENCOUNTER — Encounter (HOSPITAL_COMMUNITY): Admission: RE | Disposition: A | Payer: Self-pay | Source: Home / Self Care | Attending: Internal Medicine

## 2023-10-21 ENCOUNTER — Other Ambulatory Visit: Payer: Self-pay

## 2023-10-21 ENCOUNTER — Encounter (HOSPITAL_COMMUNITY): Payer: Self-pay | Admitting: Internal Medicine

## 2023-10-21 DIAGNOSIS — K635 Polyp of colon: Secondary | ICD-10-CM

## 2023-10-21 DIAGNOSIS — Z83719 Family history of colon polyps, unspecified: Secondary | ICD-10-CM | POA: Diagnosis not present

## 2023-10-21 DIAGNOSIS — E66813 Obesity, class 3: Secondary | ICD-10-CM

## 2023-10-21 DIAGNOSIS — D124 Benign neoplasm of descending colon: Secondary | ICD-10-CM | POA: Diagnosis not present

## 2023-10-21 DIAGNOSIS — Z6828 Body mass index (BMI) 28.0-28.9, adult: Secondary | ICD-10-CM | POA: Insufficient documentation

## 2023-10-21 DIAGNOSIS — Z87891 Personal history of nicotine dependence: Secondary | ICD-10-CM | POA: Insufficient documentation

## 2023-10-21 DIAGNOSIS — Z1211 Encounter for screening for malignant neoplasm of colon: Secondary | ICD-10-CM | POA: Insufficient documentation

## 2023-10-21 HISTORY — PX: COLONOSCOPY: SHX5424

## 2023-10-21 SURGERY — COLONOSCOPY
Anesthesia: General

## 2023-10-21 MED ORDER — LACTATED RINGERS IV SOLN
INTRAVENOUS | Status: DC
Start: 2023-10-21 — End: 2023-10-21

## 2023-10-21 MED ORDER — PROPOFOL 10 MG/ML IV BOLUS
INTRAVENOUS | Status: DC | PRN
  Administered 2023-10-21: 125 ug/kg/min via INTRAVENOUS
  Administered 2023-10-21: 100 mg via INTRAVENOUS

## 2023-10-21 MED ORDER — LIDOCAINE HCL (CARDIAC) PF 100 MG/5ML IV SOSY
PREFILLED_SYRINGE | INTRAVENOUS | Status: DC | PRN
  Administered 2023-10-21: 100 mg via INTRAVENOUS

## 2023-10-21 NOTE — Anesthesia Postprocedure Evaluation (Signed)
 Anesthesia Post Note  Patient: Jennifer Schwartz  Procedure(s) Performed: COLONOSCOPY  Patient location during evaluation: PACU Anesthesia Type: General Level of consciousness: awake and alert Pain management: pain level controlled Vital Signs Assessment: post-procedure vital signs reviewed and stable Respiratory status: spontaneous breathing, nonlabored ventilation, respiratory function stable and patient connected to nasal cannula oxygen Cardiovascular status: blood pressure returned to baseline and stable Postop Assessment: no apparent nausea or vomiting Anesthetic complications: no   No notable events documented.   Last Vitals:  Vitals:   10/21/23 1241 10/21/23 1242  BP: (!) 86/71 (!) 96/59  Pulse: 69 79  Resp: 20 20  Temp: 36.6 C   SpO2: 95% 96%    Last Pain:  Vitals:   10/21/23 1241  TempSrc: Oral  PainSc: 0-No pain                 Andrea Limes

## 2023-10-21 NOTE — Op Note (Signed)
 [facility] Patient Name: Jennifer Schwartz Procedure Date: 10/21/2023 11:26 AM MRN: [id number] Date of Birth: 1960/09/01 Attending MD: Lamar Ozell Hollingshead , MD, [id number] CSN: [id number] Age: 63 Admit Type: Outpatient Procedure:                Colonoscopy Indications:              Screening for colorectal malignant neoplasm Providers:                Lamar Ozell Hollingshead, MD, Crystal Page, Bascom Blush Referring MD:              Medicines:                Propofol per Anesthesia Complications:            No immediate complications. Estimated Blood Loss:     Estimated blood loss was minimal. Procedure:                Pre-Anesthesia Assessment:                           - Prior to the procedure, a History and Physical                            was performed, and patient medications and                            allergies were reviewed. The patient's tolerance of                            previous anesthesia was also reviewed. The risks                            and benefits of the procedure and the sedation                            options and risks were discussed with the patient.                            All questions were answered, and informed consent                            was obtained. Prior Anticoagulants: The patient has                            taken no anticoagulant or antiplatelet agents. ASA                            Grade Assessment: II - A patient with mild systemic                            disease. After reviewing the risks and benefits,                            the patient was deemed in satisfactory condition to  undergo the procedure.                           After obtaining informed consent, the colonoscope                            was passed under direct vision. Throughout the                            procedure, the patient's blood pressure, pulse, and                            oxygen saturations were monitored  continuously. The                            [id number] ([id number]) Colon was introduced through                            the anus and advanced to the the cecum, identified                            by appendiceal orifice and ileocecal valve. The                            colonoscopy was performed without difficulty. The                            patient tolerated the procedure well. The quality                            of the bowel preparation was adequate. The                            ileocecal valve, appendiceal orifice, and rectum                            were photographed. Scope In: 12:14:30 PM Scope Out: 12:38:11 PM Scope Withdrawal Time: 0 hours 15 minutes 44 seconds  Total Procedure Duration: 0 hours 23 minutes 41 seconds  Findings:      The perianal and digital rectal examinations were normal.      A 5 mm polyp was found in the descending colon. The polyp was sessile.       The polyp was removed with a cold snare. Resection and retrieval were       complete. Estimated blood loss was minimal.      The exam was otherwise without abnormality on direct and retroflexion       views. Impression:               - One 5 mm polyp in the descending colon, removed                            with a cold snare. Resected and retrieved.                           -  The examination was otherwise normal on direct                            and retroflexion views. Moderate Sedation:      Moderate (conscious) sedation was personally administered by an       anesthesia professional. The following parameters were monitored: oxygen       saturation, heart rate, blood pressure, respiratory rate, EKG, adequacy       of pulmonary ventilation, and response to care. Recommendation:           - Patient has a contact number available for                            emergencies. The signs and symptoms of potential                            delayed complications were discussed with the                             patient. Return to normal activities tomorrow.                            Written discharge instructions were provided to the                            patient.                           - Advance diet as tolerated.                           - Continue present medications.                           - Repeat colonoscopy date to be determined after                            pending pathology results are reviewed for                            surveillance.                           - Return to GI office (date not yet determined). Procedure Code(s):        --- Professional ---                           [phone], Colonoscopy, flexible; with removal of                            tumor(s), polyp(s), or other lesion(s) by snare                            technique Diagnosis Code(s):        --- Professional ---  Z12.11, Encounter for screening for malignant                            neoplasm of colon                           D12.4, Benign neoplasm of descending colon CPT copyright 2022 American Medical Association. All rights reserved. The codes documented in this report are preliminary and upon coder review may  be revised to meet current compliance requirements. Lamar HERO. Daizha Anand, MD Lamar Ozell Hollingshead, MD 10/21/2023 12:46:54 PM This report has been signed electronically. Number of Addenda: 0

## 2023-10-21 NOTE — Transfer of Care (Signed)
 Immediate Anesthesia Transfer of Care Note  Patient: Jennifer Schwartz  Procedure(s) Performed: COLONOSCOPY  Patient Location: Endoscopy Unit  Anesthesia Type:General  Level of Consciousness: awake, alert , oriented, and patient cooperative  Airway & Oxygen Therapy: Patient Spontanous Breathing  Post-op Assessment: Report given to RN and Post -op Vital signs reviewed and stable  Post vital signs: Reviewed and stable  Last Vitals:  Vitals Value Taken Time  BP 86/71 10/21/23 12:41  Temp 36.6 C 10/21/23 12:41  Pulse 69 10/21/23 12:41  Resp 20 10/21/23 12:41  SpO2 95 % 10/21/23 12:41    Last Pain:  Vitals:   10/21/23 1241  TempSrc: Oral  PainSc: 0-No pain      Patients Stated Pain Goal: 3 (10/21/23 1123)  Complications: No notable events documented.

## 2023-10-21 NOTE — H&P (Signed)
@  ON[facility]@   Gastroenterology Progress Note    Primary Care Physician:  Jolinda Norene HERO, DO Primary Gastroenterologist:  Dr. Shaaron  Pre-Procedure History & Physical: HPI:  Jennifer Schwartz is a 63 y.o. female here for  first-ever screening colonoscopy.  Mother with polyps.  History reviewed. No pertinent past medical history.  Past Surgical History:  Procedure Laterality Date   NASAL POLYPS  1988 AND 1998   WISDOM TOOTH EXTRACTION      Prior to Admission medications   Medication Sig Start Date End Date Taking? Authorizing Provider  varenicline  (CHANTIX  CONTINUING MONTH PAK) 1 MG tablet Take 1 tablet (1 mg total) by mouth 2 (two) times daily. 08/16/23   Jolinda Norene HERO, DO    Allergies as of 10/11/2023 - Review Complete 09/14/2023  Allergen Reaction Noted   Desloratadine  Swelling 02/22/2023    Family History  Problem Relation Age of Onset   Diabetes Mother    Stroke Mother    Heart disease Mother    Heart disease Father    Hypertension Father    Breast cancer Neg Hx     Social History   Socioeconomic History   Marital status: Married    Spouse name: Not on file   Number of children: Not on file   Years of education: Not on file   Highest education level: Not on file  Occupational History   Not on file  Tobacco Use   Smoking status: Former    Current packs/day: 0.00    Types: Cigarettes    Quit date: 09/21/2023    Years since quitting: 0.0   Smokeless tobacco: Never  Vaping Use   Vaping status: Never Used  Substance and Sexual Activity   Alcohol use: Never   Drug use: Never   Sexual activity: Not on file  Other Topics Concern   Not on file  Social History Narrative   Not on file   Social Drivers of Health   Financial Resource Strain: Not on file  Food Insecurity: Not on file  Transportation Needs: Not on file  Physical Activity: Not on file  Stress: Not on file  Social Connections: Not on file  Intimate Partner Violence: Not on file    Review  of Systems   See HPI, otherwise negative ROS  Physical Exam: BP 119/72   Pulse 79   Temp 97.6 F (36.4 C) (Oral)   Resp 18   Ht 5' 7 (1.702 m)   Wt 83.9 kg   LMP 02/25/2011   SpO2 99%   BMI 28.98 kg/m  General:   Alert,  Well-developed, well-nourished, pleasant and cooperative in NAD Neck:  Supple; no masses or thyromegaly. No significant cervical adenopathy. Lungs:  Clear throughout to auscultation.   No wheezes, crackles, or rhonchi. No acute distress. Heart:  Regular rate and rhythm; no murmurs, clicks, rubs,  or gallops. Abdomen: Non-distended, normal bowel sounds.  Soft and nontender without appreciable mass or hepatosplenomegaly.    Impression/Plan:     63 year old lady here for first-ever colonoscopy for screening purposes.  Mother with colonic polyps.  I have offered the patient a screening colonoscopy today. The risks, benefits, limitations, alternatives and imponderables have been reviewed with the patient. Questions have been answered. All parties are agreeable.     Notice: This dictation was prepared with Dragon dictation along with smaller phrase technology. Any transcriptional errors that result from this process are unintentional and may not be corrected upon review.

## 2023-10-21 NOTE — Anesthesia Preprocedure Evaluation (Signed)
 Anesthesia Evaluation  Patient identified by MRN, date of birth, ID band Patient awake    Reviewed: Allergy & Precautions, H&P , NPO status , Patient's Chart, lab work & pertinent test results  Airway Mallampati: II  TM Distance: >3 FB Neck ROM: Full    Dental no notable dental hx.    Pulmonary neg pulmonary ROS, former smoker   Pulmonary exam normal breath sounds clear to auscultation       Cardiovascular negative cardio ROS Normal cardiovascular exam Rhythm:Regular Rate:Normal     Neuro/Psych negative neurological ROS  negative psych ROS   GI/Hepatic negative GI ROS, Neg liver ROS,,,  Endo/Other    Class 3 obesity  Renal/GU negative Renal ROS  negative genitourinary   Musculoskeletal negative musculoskeletal ROS (+)    Abdominal   Peds negative pediatric ROS (+)  Hematology negative hematology ROS (+)   Anesthesia Other Findings   Reproductive/Obstetrics negative OB ROS                              Anesthesia Physical Anesthesia Plan  ASA: 2  Anesthesia Plan: General   Post-op Pain Management:    Induction: Intravenous  PONV Risk Score and Plan:   Airway Management Planned: Nasal Cannula  Additional Equipment:   Intra-op Plan:   Post-operative Plan:   Informed Consent: I have reviewed the patients History and Physical, chart, labs and discussed the procedure including the risks, benefits and alternatives for the proposed anesthesia with the patient or authorized representative who has indicated his/her understanding and acceptance.     Dental advisory given  Plan Discussed with: CRNA  Anesthesia Plan Comments:         Anesthesia Quick Evaluation

## 2023-10-21 NOTE — Discharge Instructions (Signed)
  Colonoscopy Discharge Instructions  Read the instructions outlined below and refer to this sheet in the next few weeks. These discharge instructions provide you with general information on caring for yourself after you leave the hospital. Your doctor may also give you specific instructions. While your treatment has been planned according to the most current medical practices available, unavoidable complications occasionally occur. If you have any problems or questions after discharge, call Dr. Riley Cheadle at [phone]. ACTIVITY You may resume your regular activity, but move at a slower pace for the next 24 hours.  Take frequent rest periods for the next 24 hours.  Walking will help get rid of the air and reduce the bloated feeling in your belly (abdomen).  No driving for 24 hours (because of the medicine (anesthesia) used during the test).   Do not sign any important legal documents or operate any machinery for 24 hours (because of the anesthesia used during the test).  NUTRITION Drink plenty of fluids.  You may resume your normal diet as instructed by your doctor.  Begin with a light meal and progress to your normal diet. Heavy or fried foods are harder to digest and may make you feel sick to your stomach (nauseated).  Avoid alcoholic beverages for 24 hours or as instructed.  MEDICATIONS You may resume your normal medications unless your doctor tells you otherwise.  WHAT YOU CAN EXPECT TODAY Some feelings of bloating in the abdomen.  Passage of more gas than usual.  Spotting of blood in your stool or on the toilet paper.  IF YOU HAD POLYPS REMOVED DURING THE COLONOSCOPY: No aspirin products for 7 days or as instructed.  No alcohol for 7 days or as instructed.  Eat a soft diet for the next 24 hours.  FINDING OUT THE RESULTS OF YOUR TEST Not all test results are available during your visit. If your test results are not back during the visit, make an appointment with your caregiver to find out the  results. Do not assume everything is normal if you have not heard from your caregiver or the medical facility. It is important for you to follow up on all of your test results.  SEEK IMMEDIATE MEDICAL ATTENTION IF: You have more than a spotting of blood in your stool.  Your belly is swollen (abdominal distention).  You are nauseated or vomiting.  You have a temperature over 101.  You have abdominal pain or discomfort that is severe or gets worse throughout the day.      1 small polyp found and removed  Further recommendations to follow pending review of pathology report

## 2023-10-22 LAB — SURGICAL PATHOLOGY

## 2023-10-23 ENCOUNTER — Ambulatory Visit: Payer: Self-pay | Admitting: Internal Medicine

## 2023-10-25 ENCOUNTER — Encounter (HOSPITAL_COMMUNITY): Payer: Self-pay | Admitting: Internal Medicine

## 2024-08-15 ENCOUNTER — Encounter: Payer: Self-pay | Admitting: Family Medicine
